# Patient Record
Sex: Male | Born: 1950 | Race: White | Hispanic: No | Marital: Married | Smoking: Never smoker
Health system: Southern US, Community
[De-identification: ages and names within clinical notes are randomized; demographics above are authoritative.]

## PROBLEM LIST (undated history)

## (undated) DIAGNOSIS — I10 Essential (primary) hypertension: Secondary | ICD-10-CM

## (undated) DIAGNOSIS — I5189 Other ill-defined heart diseases: Secondary | ICD-10-CM

## (undated) DIAGNOSIS — N289 Disorder of kidney and ureter, unspecified: Secondary | ICD-10-CM

## (undated) DIAGNOSIS — E785 Hyperlipidemia, unspecified: Secondary | ICD-10-CM

## (undated) DIAGNOSIS — I499 Cardiac arrhythmia, unspecified: Secondary | ICD-10-CM

## (undated) DIAGNOSIS — N281 Cyst of kidney, acquired: Secondary | ICD-10-CM

## (undated) DIAGNOSIS — K219 Gastro-esophageal reflux disease without esophagitis: Secondary | ICD-10-CM

## (undated) HISTORY — DX: Disorder of kidney and ureter, unspecified: N28.9

## (undated) HISTORY — DX: Essential (primary) hypertension: I10

## (undated) HISTORY — DX: Cyst of kidney, acquired: N28.1

## (undated) HISTORY — DX: Gastro-esophageal reflux disease without esophagitis: K21.9

## (undated) HISTORY — DX: Hyperlipidemia, unspecified: E78.5

## (undated) HISTORY — DX: Cardiac arrhythmia, unspecified: I49.9

## (undated) HISTORY — DX: Other ill-defined heart diseases: I51.89

---

## 2000-10-25 ENCOUNTER — Encounter: Payer: Self-pay | Admitting: Urology

## 2000-10-25 ENCOUNTER — Encounter: Admission: RE | Admit: 2000-10-25 | Discharge: 2000-10-25 | Payer: Self-pay | Admitting: Urology

## 2003-09-18 ENCOUNTER — Encounter (INDEPENDENT_AMBULATORY_CARE_PROVIDER_SITE_OTHER): Payer: Self-pay | Admitting: *Deleted

## 2003-09-18 ENCOUNTER — Ambulatory Visit (HOSPITAL_COMMUNITY): Admission: RE | Admit: 2003-09-18 | Discharge: 2003-09-18 | Payer: Self-pay | Admitting: Urology

## 2004-02-13 ENCOUNTER — Emergency Department (HOSPITAL_COMMUNITY): Admission: EM | Admit: 2004-02-13 | Discharge: 2004-02-13 | Payer: Self-pay | Admitting: Emergency Medicine

## 2004-02-13 ENCOUNTER — Encounter: Admission: RE | Admit: 2004-02-13 | Discharge: 2004-02-13 | Payer: Self-pay | Admitting: Internal Medicine

## 2005-04-04 HISTORY — PX: KIDNEY CYST REMOVAL: SHX684

## 2006-01-01 ENCOUNTER — Encounter: Admission: RE | Admit: 2006-01-01 | Discharge: 2006-01-01 | Payer: Self-pay | Admitting: Internal Medicine

## 2006-04-24 ENCOUNTER — Ambulatory Visit: Payer: Self-pay | Admitting: Family Medicine

## 2006-04-24 ENCOUNTER — Encounter: Admission: RE | Admit: 2006-04-24 | Discharge: 2006-04-24 | Payer: Self-pay | Admitting: Family Medicine

## 2006-05-13 ENCOUNTER — Encounter: Admission: RE | Admit: 2006-05-13 | Discharge: 2006-05-13 | Payer: Self-pay | Admitting: Family Medicine

## 2006-05-19 ENCOUNTER — Ambulatory Visit: Payer: Self-pay | Admitting: Family Medicine

## 2006-05-19 LAB — CONVERTED CEMR LAB
Calcium: 9.4 mg/dL (ref 8.4–10.5)
Glucose, Bld: 101 mg/dL — ABNORMAL HIGH (ref 70–99)
Sodium: 141 meq/L (ref 135–145)

## 2006-08-29 DIAGNOSIS — E785 Hyperlipidemia, unspecified: Secondary | ICD-10-CM | POA: Insufficient documentation

## 2006-08-29 DIAGNOSIS — I10 Essential (primary) hypertension: Secondary | ICD-10-CM | POA: Insufficient documentation

## 2006-08-29 DIAGNOSIS — N281 Cyst of kidney, acquired: Secondary | ICD-10-CM

## 2006-08-29 DIAGNOSIS — E119 Type 2 diabetes mellitus without complications: Secondary | ICD-10-CM | POA: Insufficient documentation

## 2006-11-22 ENCOUNTER — Ambulatory Visit: Payer: Self-pay | Admitting: Family Medicine

## 2006-11-22 DIAGNOSIS — R945 Abnormal results of liver function studies: Secondary | ICD-10-CM | POA: Insufficient documentation

## 2006-11-22 DIAGNOSIS — K7689 Other specified diseases of liver: Secondary | ICD-10-CM

## 2006-11-22 DIAGNOSIS — R61 Generalized hyperhidrosis: Secondary | ICD-10-CM | POA: Insufficient documentation

## 2006-11-22 DIAGNOSIS — K760 Fatty (change of) liver, not elsewhere classified: Secondary | ICD-10-CM | POA: Insufficient documentation

## 2006-11-26 LAB — CONVERTED CEMR LAB
ALT: 70 units/L — ABNORMAL HIGH (ref 0–53)
BUN: 13 mg/dL (ref 6–23)
Basophils Relative: 0.6 % (ref 0.0–1.0)
Chloride: 101 meq/L (ref 96–112)
Creatinine, Ser: 0.9 mg/dL (ref 0.4–1.5)
Eosinophils Relative: 1.3 % (ref 0.0–5.0)
Glucose, Bld: 114 mg/dL — ABNORMAL HIGH (ref 70–99)
HCT: 44.1 % (ref 39.0–52.0)
Hemoglobin: 15.2 g/dL (ref 13.0–17.0)
LDL Cholesterol: 107 mg/dL — ABNORMAL HIGH (ref 0–99)
MCV: 96 fL (ref 78.0–100.0)
Monocytes Absolute: 0.5 10*3/uL (ref 0.2–0.7)
Monocytes Relative: 6.5 % (ref 3.0–11.0)
Neutro Abs: 4.4 10*3/uL (ref 1.4–7.7)
Platelets: 202 10*3/uL (ref 150–400)
Potassium: 4.5 meq/L (ref 3.5–5.1)
RDW: 12.1 % (ref 11.5–14.6)
TSH: 2.87 microintl units/mL (ref 0.35–5.50)
Total CHOL/HDL Ratio: 6.5
WBC: 7 10*3/uL (ref 4.5–10.5)

## 2006-11-27 ENCOUNTER — Encounter (INDEPENDENT_AMBULATORY_CARE_PROVIDER_SITE_OTHER): Payer: Self-pay | Admitting: *Deleted

## 2006-11-27 ENCOUNTER — Telehealth (INDEPENDENT_AMBULATORY_CARE_PROVIDER_SITE_OTHER): Payer: Self-pay | Admitting: *Deleted

## 2007-04-23 ENCOUNTER — Telehealth (INDEPENDENT_AMBULATORY_CARE_PROVIDER_SITE_OTHER): Payer: Self-pay | Admitting: *Deleted

## 2007-04-23 ENCOUNTER — Ambulatory Visit: Payer: Self-pay | Admitting: Family Medicine

## 2007-05-02 ENCOUNTER — Telehealth (INDEPENDENT_AMBULATORY_CARE_PROVIDER_SITE_OTHER): Payer: Self-pay | Admitting: *Deleted

## 2007-05-04 ENCOUNTER — Telehealth (INDEPENDENT_AMBULATORY_CARE_PROVIDER_SITE_OTHER): Payer: Self-pay | Admitting: *Deleted

## 2007-05-04 ENCOUNTER — Encounter (INDEPENDENT_AMBULATORY_CARE_PROVIDER_SITE_OTHER): Payer: Self-pay | Admitting: *Deleted

## 2007-05-04 LAB — CONVERTED CEMR LAB
ALT: 83 units/L — ABNORMAL HIGH (ref 0–53)
CO2: 25 meq/L (ref 19–32)
Creatinine, Ser: 1 mg/dL (ref 0.4–1.5)
GFR calc Af Amer: 100 mL/min
Glucose, Bld: 159 mg/dL — ABNORMAL HIGH (ref 70–99)
Hgb A1c MFr Bld: 7.2 % — ABNORMAL HIGH (ref 4.6–6.0)
Potassium: 4.1 meq/L (ref 3.5–5.1)
VLDL: 37 mg/dL (ref 0–40)

## 2007-05-15 ENCOUNTER — Telehealth (INDEPENDENT_AMBULATORY_CARE_PROVIDER_SITE_OTHER): Payer: Self-pay | Admitting: *Deleted

## 2007-09-17 ENCOUNTER — Ambulatory Visit: Payer: Self-pay | Admitting: Family Medicine

## 2007-09-17 DIAGNOSIS — K219 Gastro-esophageal reflux disease without esophagitis: Secondary | ICD-10-CM | POA: Insufficient documentation

## 2007-09-17 DIAGNOSIS — N259 Disorder resulting from impaired renal tubular function, unspecified: Secondary | ICD-10-CM | POA: Insufficient documentation

## 2007-09-19 ENCOUNTER — Encounter (INDEPENDENT_AMBULATORY_CARE_PROVIDER_SITE_OTHER): Payer: Self-pay | Admitting: *Deleted

## 2007-09-19 LAB — CONVERTED CEMR LAB
AST: 126 units/L — ABNORMAL HIGH (ref 0–37)
Albumin: 4.2 g/dL (ref 3.5–5.2)
BUN: 13 mg/dL (ref 6–23)
Bilirubin, Direct: 0.1 mg/dL (ref 0.0–0.3)
Calcium: 9.5 mg/dL (ref 8.4–10.5)
Creatinine,U: 76 mg/dL
Direct LDL: 129.6 mg/dL
GFR calc Af Amer: 113 mL/min
Hgb A1c MFr Bld: 8.5 % — ABNORMAL HIGH (ref 4.6–6.0)
Microalb, Ur: 0.3 mg/dL (ref 0.0–1.9)
Potassium: 4 meq/L (ref 3.5–5.1)
Total CHOL/HDL Ratio: 6.3
Triglycerides: 210 mg/dL (ref 0–149)

## 2007-09-20 ENCOUNTER — Encounter (INDEPENDENT_AMBULATORY_CARE_PROVIDER_SITE_OTHER): Payer: Self-pay | Admitting: *Deleted

## 2007-10-08 ENCOUNTER — Encounter: Admission: RE | Admit: 2007-10-08 | Discharge: 2007-10-08 | Payer: Self-pay | Admitting: Family Medicine

## 2007-10-10 ENCOUNTER — Telehealth (INDEPENDENT_AMBULATORY_CARE_PROVIDER_SITE_OTHER): Payer: Self-pay | Admitting: *Deleted

## 2007-10-18 ENCOUNTER — Encounter: Payer: Self-pay | Admitting: Family Medicine

## 2007-10-31 ENCOUNTER — Telehealth (INDEPENDENT_AMBULATORY_CARE_PROVIDER_SITE_OTHER): Payer: Self-pay | Admitting: *Deleted

## 2008-01-03 ENCOUNTER — Ambulatory Visit: Payer: Self-pay | Admitting: Family Medicine

## 2008-01-10 ENCOUNTER — Telehealth (INDEPENDENT_AMBULATORY_CARE_PROVIDER_SITE_OTHER): Payer: Self-pay | Admitting: *Deleted

## 2008-01-14 LAB — CONVERTED CEMR LAB
ALT: 37 units/L (ref 0–53)
Alkaline Phosphatase: 46 units/L (ref 39–117)
BUN: 16 mg/dL (ref 6–23)
Bilirubin, Direct: 0.1 mg/dL (ref 0.0–0.3)
Cholesterol: 175 mg/dL (ref 0–200)
GFR calc non Af Amer: 93 mL/min
Glucose, Bld: 119 mg/dL — ABNORMAL HIGH (ref 70–99)
HDL: 30.4 mg/dL — ABNORMAL LOW (ref >39.0)
Hgb A1c MFr Bld: 6.2 % — ABNORMAL HIGH (ref 4.6–6.0)
Microalb Creat Ratio: 4.2 mg/g (ref 0.0–30.0)
Microalb, Ur: 0.3 mg/dL (ref 0.0–1.9)
Sodium: 138 meq/L (ref 135–145)
Total CHOL/HDL Ratio: 5.8
Total Protein: 7.2 g/dL (ref 6.0–8.3)
Triglycerides: 152 mg/dL — ABNORMAL HIGH (ref 0–149)
VLDL: 30 mg/dL (ref 0–40)

## 2008-01-15 ENCOUNTER — Encounter (INDEPENDENT_AMBULATORY_CARE_PROVIDER_SITE_OTHER): Payer: Self-pay | Admitting: *Deleted

## 2008-05-06 ENCOUNTER — Ambulatory Visit: Payer: Self-pay | Admitting: Family Medicine

## 2008-05-06 DIAGNOSIS — M25519 Pain in unspecified shoulder: Secondary | ICD-10-CM | POA: Insufficient documentation

## 2008-05-16 ENCOUNTER — Telehealth: Payer: Self-pay | Admitting: Family Medicine

## 2008-05-16 DIAGNOSIS — M542 Cervicalgia: Secondary | ICD-10-CM | POA: Insufficient documentation

## 2008-05-29 ENCOUNTER — Encounter: Payer: Self-pay | Admitting: Family Medicine

## 2008-10-07 ENCOUNTER — Telehealth (INDEPENDENT_AMBULATORY_CARE_PROVIDER_SITE_OTHER): Payer: Self-pay | Admitting: *Deleted

## 2008-10-28 ENCOUNTER — Ambulatory Visit: Payer: Self-pay | Admitting: Family Medicine

## 2008-10-28 DIAGNOSIS — D239 Other benign neoplasm of skin, unspecified: Secondary | ICD-10-CM | POA: Insufficient documentation

## 2008-10-29 ENCOUNTER — Encounter (INDEPENDENT_AMBULATORY_CARE_PROVIDER_SITE_OTHER): Payer: Self-pay | Admitting: *Deleted

## 2008-10-30 ENCOUNTER — Encounter (INDEPENDENT_AMBULATORY_CARE_PROVIDER_SITE_OTHER): Payer: Self-pay | Admitting: *Deleted

## 2008-11-19 ENCOUNTER — Encounter: Payer: Self-pay | Admitting: Family Medicine

## 2009-02-02 ENCOUNTER — Telehealth: Payer: Self-pay | Admitting: Family Medicine

## 2009-02-12 ENCOUNTER — Ambulatory Visit: Payer: Self-pay | Admitting: Family Medicine

## 2009-02-12 LAB — CONVERTED CEMR LAB
Bilirubin Urine: NEGATIVE
Glucose, Urine, Semiquant: 250
Protein, U semiquant: NEGATIVE
Specific Gravity, Urine: 1.015
Urobilinogen, UA: 0.2
WBC Urine, dipstick: NEGATIVE
pH: 5.5

## 2009-02-17 ENCOUNTER — Encounter: Payer: Self-pay | Admitting: Family Medicine

## 2009-02-17 ENCOUNTER — Encounter: Admission: RE | Admit: 2009-02-17 | Discharge: 2009-02-17 | Payer: Self-pay | Admitting: Emergency Medicine

## 2009-02-25 ENCOUNTER — Telehealth: Payer: Self-pay | Admitting: Family Medicine

## 2009-02-25 LAB — CONVERTED CEMR LAB
Alkaline Phosphatase: 43 units/L (ref 39–117)
Cholesterol: 195 mg/dL (ref 0–200)
Creatinine,U: 72 mg/dL
GFR calc non Af Amer: 82.17 mL/min (ref >60)
Microalb, Ur: 0.3 mg/dL (ref 0.0–1.9)
Sodium: 140 meq/L (ref 135–145)
Total Protein: 7.4 g/dL (ref 6.0–8.3)
VLDL: 39.8 mg/dL (ref 0.0–40.0)

## 2009-09-23 ENCOUNTER — Ambulatory Visit: Payer: Self-pay | Admitting: Family Medicine

## 2009-09-23 ENCOUNTER — Telehealth (INDEPENDENT_AMBULATORY_CARE_PROVIDER_SITE_OTHER): Payer: Self-pay

## 2009-09-23 DIAGNOSIS — R079 Chest pain, unspecified: Secondary | ICD-10-CM | POA: Insufficient documentation

## 2009-09-24 ENCOUNTER — Ambulatory Visit: Payer: Self-pay

## 2009-09-24 ENCOUNTER — Encounter: Payer: Self-pay | Admitting: Cardiology

## 2009-09-24 ENCOUNTER — Telehealth: Payer: Self-pay | Admitting: Family Medicine

## 2009-09-24 ENCOUNTER — Ambulatory Visit: Payer: Self-pay | Admitting: Cardiology

## 2009-09-24 ENCOUNTER — Encounter: Payer: Self-pay | Admitting: Family Medicine

## 2009-09-24 ENCOUNTER — Encounter (HOSPITAL_COMMUNITY): Admission: RE | Admit: 2009-09-24 | Discharge: 2009-12-09 | Payer: Self-pay | Admitting: Family Medicine

## 2009-09-24 ENCOUNTER — Ambulatory Visit (HOSPITAL_COMMUNITY): Admission: RE | Admit: 2009-09-24 | Discharge: 2009-09-24 | Payer: Self-pay | Admitting: Family Medicine

## 2009-09-25 DIAGNOSIS — I519 Heart disease, unspecified: Secondary | ICD-10-CM | POA: Insufficient documentation

## 2009-10-16 ENCOUNTER — Encounter: Payer: Self-pay | Admitting: Family Medicine

## 2009-11-04 ENCOUNTER — Encounter: Admission: RE | Admit: 2009-11-04 | Discharge: 2009-11-04 | Payer: Self-pay | Admitting: Urology

## 2009-11-12 ENCOUNTER — Ambulatory Visit (HOSPITAL_COMMUNITY): Admission: RE | Admit: 2009-11-12 | Discharge: 2009-11-12 | Payer: Self-pay | Admitting: Urology

## 2009-11-23 ENCOUNTER — Ambulatory Visit: Payer: Self-pay | Admitting: Cardiology

## 2010-04-22 ENCOUNTER — Ambulatory Visit
Admission: RE | Admit: 2010-04-22 | Discharge: 2010-04-22 | Payer: Self-pay | Source: Home / Self Care | Attending: Family Medicine | Admitting: Family Medicine

## 2010-04-22 LAB — CONVERTED CEMR LAB
Ketones, urine, test strip: NEGATIVE
Protein, U semiquant: NEGATIVE
pH: 5

## 2010-04-23 ENCOUNTER — Ambulatory Visit (HOSPITAL_BASED_OUTPATIENT_CLINIC_OR_DEPARTMENT_OTHER)
Admission: RE | Admit: 2010-04-23 | Discharge: 2010-04-23 | Payer: Self-pay | Source: Home / Self Care | Attending: Family Medicine | Admitting: Family Medicine

## 2010-04-25 ENCOUNTER — Encounter: Payer: Self-pay | Admitting: Urology

## 2010-04-25 ENCOUNTER — Encounter: Payer: Self-pay | Admitting: Family Medicine

## 2010-05-02 LAB — CONVERTED CEMR LAB
ALT: 56 units/L — ABNORMAL HIGH (ref 0–53)
AST: 38 units/L — ABNORMAL HIGH (ref 0–37)
AST: 47 units/L — ABNORMAL HIGH (ref 0–37)
Albumin: 4.3 g/dL (ref 3.5–5.2)
Albumin: 4.4 g/dL (ref 3.5–5.2)
Basophils Absolute: 0 10*3/uL (ref 0.0–0.1)
Basophils Relative: 0.7 % (ref 0.0–3.0)
Bilirubin Urine: NEGATIVE
Bilirubin, Direct: 0.1 mg/dL (ref 0.0–0.3)
Calcium: 9.3 mg/dL (ref 8.4–10.5)
Calcium: 9.5 mg/dL (ref 8.4–10.5)
Cholesterol: 186 mg/dL (ref 0–200)
Creatinine, Ser: 1.1 mg/dL (ref 0.4–1.5)
Creatinine,U: 83.6 mg/dL
Creatinine,U: 91.2 mg/dL
Eosinophils Absolute: 0.1 10*3/uL (ref 0.0–0.7)
Eosinophils Relative: 1.1 % (ref 0.0–5.0)
GFR calc non Af Amer: 76.66 mL/min (ref >60)
GFR calc non Af Amer: 82.26 mL/min (ref >60)
Glucose, Bld: 126 mg/dL — ABNORMAL HIGH (ref 70–99)
HCT: 43.1 % (ref 39.0–52.0)
HDL: 37.2 mg/dL — ABNORMAL LOW (ref >39.00)
Hemoglobin: 14.7 g/dL (ref 13.0–17.0)
Hgb A1c MFr Bld: 6.5 % (ref 4.6–6.5)
Hgb A1c MFr Bld: 6.7 % — ABNORMAL HIGH (ref 4.6–6.5)
LDL Cholesterol: 118 mg/dL — ABNORMAL HIGH (ref 0–99)
MCHC: 33.5 g/dL (ref 30.0–36.0)
MCHC: 34 g/dL (ref 30.0–36.0)
MCV: 97.8 fL (ref 78.0–100.0)
Microalb Creat Ratio: 0.8 mg/g (ref 0.0–30.0)
Microalb Creat Ratio: 7.2 mg/g (ref 0.0–30.0)
Monocytes Absolute: 0.3 10*3/uL (ref 0.1–1.0)
Monocytes Absolute: 0.4 10*3/uL (ref 0.1–1.0)
Monocytes Relative: 4.5 % (ref 3.0–12.0)
Neutro Abs: 2.8 10*3/uL (ref 1.4–7.7)
Neutrophils Relative %: 54.2 % (ref 43.0–77.0)
Neutrophils Relative %: 60 % (ref 43.0–77.0)
Nitrite: NEGATIVE
PSA: 0.35 ng/mL (ref 0.10–4.00)
Platelets: 203 10*3/uL (ref 150.0–400.0)
Potassium: 4.8 meq/L (ref 3.5–5.1)
Protein, U semiquant: NEGATIVE
RBC: 4.34 M/uL (ref 4.22–5.81)
RBC: 4.38 M/uL (ref 4.22–5.81)
RDW: 12.1 % (ref 11.5–14.6)
RDW: 13.2 % (ref 11.5–14.6)
Total CHOL/HDL Ratio: 5
Total CK: 464 units/L — ABNORMAL HIGH (ref 7–232)
Total Protein: 7.7 g/dL (ref 6.0–8.3)
Triglycerides: 180 mg/dL — ABNORMAL HIGH (ref 0.0–149.0)
Troponin I: <0.01 ng/mL (ref <0.06)
VLDL: 34.2 mg/dL (ref 0.0–40.0)
VLDL: 36 mg/dL (ref 0.0–40.0)
WBC Urine, dipstick: NEGATIVE
WBC: 6 10*3/uL (ref 4.5–10.5)
pH: 5

## 2010-05-04 NOTE — Consult Note (Signed)
Summary: [facility]  [facility]   Imported By: Lanelle Bal 10/22/2009 12:35:18  _____________________________________________________________________  External Attachment:    Type:   Image     Comment:   External Document

## 2010-05-04 NOTE — Assessment & Plan Note (Signed)
Summary: np6/diastolic dysfunction   Primary Provider:  Laury Axon  CC:  no complaints.  History of Present Illness: 60 year old male for evaluation of diastolic dysfunction and chest pain. A Myoview was performed in June of 2011. Ejection fraction was 62% and there was no ischemia. An echocardiogram was performed in June of 2011 that showed normal LV function, grade 2 diastolic dysfunction. Because of the above we were asked to further evaluate. The patient states that in April he was running sprints. He developed a sharp pain in the left axillary area. It lasted seconds and resolved when he stopped. He continues to have an aching sensation in his left axilla. It is continuous without ever completely resolving. He occasionally feels numbness down his left arm but states he has had problems with cervical disc. He denies any significant dyspnea on exertion, orthopnea, PND, pedal edema, palpitations, syncope. He exercises routinely swimming for one hour or walking for one hour with no chest pain. Because of his chest pain and diastolic dysfunction on his echo we were asked to further evaluate.  Current Medications (verified): 1)  Metformin Hcl 1000 Mg  Tabs (Metformin Hcl) .Marland Kitchen.. 1 By Mouth Two Times A Day 2)  Lisinopril-Hydrochlorothiazide 20-12.5 Mg  Tabs (Lisinopril-Hydrochlorothiazide) .... Take One Tablet in Morning and One Tablet in The Evening 3)  Free Style Freedom Lite Test Strips .... Use One Strip Each Morning Before Breakfast. 4)  Drysol 20 %  Soln (Aluminum Chloride) .... Apply At Night Time For One Month and Then Apply Nightly Once Weekly.prn 5)  Freestyle Lancets   Misc (Lancets) .... Use One As Directed Each Morning 6)  Aspirin 81 Mg Chew (Aspirin) .... As Needed  Allergies: 1)  ! Codeine 2)  ! Zocor 3)  ! Pravachol  Past History:  Past Medical History: HYPERTENSION  HYPERLIPIDEMIA declined treatment DIASTOLIC DYSFUNCTION  RENAL CYST NEVI, MULTIPLE  GERD  RENAL INSUFFICIENCY    FATTY LIVER DISEASE  HYPERHIDROSIS DIABETES MELLITUS, TYPE II  H/O statin intolerance  Past Surgical History: Reviewed history from 10/28/2008 and no changes required. drained kidney cyst 2007---  [facility]   Family History: Reviewed history from 10/28/2008 and no changes required. Father with CABG in his 51s Family History Diabetes 1st degree relative Family History High cholesterol Family History Hypertension Family History of Stroke F 1st degree relative 58yo Family History of Skin cancer  Social History: Reviewed history from 10/28/2008 and no changes required. Never Smoked Alcohol use-rare Drug use-no Married Occupation: Runner, broadcasting/film/video--- [employer] Regular exercise-yes  Review of Systems       no fevers or chills, productive cough, hemoptysis, dysphasia, odynophagia, melena, hematochezia, dysuria, hematuria, rash, seizure activity, orthopnea, PND, pedal edema, claudication. Remaining systems are negative.   Vital Signs:  Patient profile:   60 year old male Height:      70 inches Weight:      200 pounds BMI:     28.80 Pulse rate:   63 / minute Resp:     14 per minute BP sitting:   119 / 67  (left arm)  Vitals Entered By: Kem Parkinson (November 23, 2009 11:51 AM)  Physical Exam  General:  Well developed/well nourished in NAD Skin warm/dry Patient not depressed No peripheral clubbing Back-normal HEENT-normal/normal eyelids Neck supple/normal carotid upstroke bilaterally; no bruits; no JVD; no thyromegaly chest - CTA/ normal expansion CV - RRR/normal S1 and S2; no  rubs or gallops; no rub; PMI nondisplaced; 2/6 systolic ejection murmur. Abdomen -NT/ND, no HSM, no mass, +  bowel sounds, no bruit 2+ femoral pulses, no bruits Ext-no edema, chords, 2+ DP Neuro-grossly nonfocal     EKG  Procedure date:  09/23/2009  Findings:      past this rhythm at a rate of 59. Axis normal. Minor nonspecific ST changes.  Impression &  Recommendations:  Problem # 1:  CHEST PAIN UNSPECIFIED (ICD-786.50) Symptoms extremely atypical. His left upper extremity numbness and discomfort is most likely related to his cervical disc problem. He exercises routinely without any significant chest pain. His myoview was normal. I do not think further cardiac evaluation is indicated. I did ask the patient to take enteric-coated aspirin 81 mg p.o. daily. His updated medication list for this problem includes:    Lisinopril-hydrochlorothiazide 20-12.5 Mg Tabs (Lisinopril-hydrochlorothiazide) .Marland Kitchen... Take one tablet in morning and one tablet in the evening    Aspirin 81 Mg Chew (Aspirin) .Marland Kitchen... As needed  Problem # 2:  HYPERTENSION (ICD-401.9) His blood pressure is controlled on present medications. He was noted to have grade 2 diastolic dysfunction on his echocardiogram. I've asked him to purchase a blood pressure cuff and monitor at home. His systolic blood pressure should be less than 130 and diastolic less than 85. He will continue on his present blood pressure medications and be diligent in treating. His updated medication list for this problem includes:    Lisinopril-hydrochlorothiazide 20-12.5 Mg Tabs (Lisinopril-hydrochlorothiazide) .Marland Kitchen... Take one tablet in morning and one tablet in the evening    Aspirin 81 Mg Chew (Aspirin) .Marland Kitchen... As needed  Problem # 3:  HYPERLIPIDEMIA (ICD-272.4) Continue diet. Management per primary care. The following medications were removed from the medication list:    Antara 130 Mg Caps (Fenofibrate micronized) .Marland Kitchen... 1 by mouth once daily.    Lipitor 10 Mg Tabs (Atorvastatin calcium) .Marland Kitchen... 1 by mouth at bedtime.  Problem # 4:  DIASTOLIC DYSFUNCTION (ICD-429.9) As per #2 we will need to be diligent in treating his diabetes and hypertension. He is not having symptoms from this and therefore a diuretic has not been added.  Problem # 5:  DIABETES MELLITUS, TYPE II (ICD-250.00)  His updated medication list for this  problem includes:    Metformin Hcl 1000 Mg Tabs (Metformin hcl) .Marland Kitchen... 1 by mouth two times a day    Lisinopril-hydrochlorothiazide 20-12.5 Mg Tabs (Lisinopril-hydrochlorothiazide) .Marland Kitchen... Take one tablet in morning and one tablet in the evening    Aspirin 81 Mg Chew (Aspirin) .Marland Kitchen... As needed

## 2010-05-04 NOTE — Assessment & Plan Note (Signed)
Summary: med refill with blood work//lch   Vital Signs:  Patient profile:   60 year old male Height:      70 inches Weight:      202 pounds BMI:     29.09 Pulse rate:   82 / minute Pulse rhythm:   regular BP sitting:   120 / 76  (left arm) Cuff size:   regular  Vitals Entered By: Army Fossa CMA (September 23, 2009 8:08 AM) CC: Pt here for med refills and bloodwork. Also states he has a pain in his left shoulder x 4 months that comes and goes.    History of Present Illness: Pt here for med refill and labs He c/o L shoulder pain x 4 months.  Pt states he had chest pain while running and when he rested it went away within seconds.  last episode was a few days ago--- pt was sitting and watching tv when L side chest and armpit started to hurt and it radiated down L arm--it lasted several seconds.  + some nausea.  It does not occur everytime he exercises.  He walks everyday.    Hyperlipidemia follow-up      This is a 60 year old man who presents for Hyperlipidemia follow-up.  The patient denies muscle aches, GI upset, abdominal pain, flushing, itching, constipation, diarrhea, and fatigue.  Other symptoms include chest pain/pressure and exercise intolerance.  The patient denies the following symptoms: dypsnea, palpitations, syncope, and pedal edema.  Compliance with medications (by patient report) has been near 100%.  Dietary compliance has been good.  The patient reports exercising daily.    Hypertension follow-up      The patient also presents for Hypertension follow-up.  The patient denies lightheadedness, urinary frequency, headaches, edema, impotence, rash, and fatigue.  Associated symptoms include chest pain, chest pressure, exercise intolerance, and dyspnea.  The patient denies the following associated symptoms: palpitations, syncope, leg edema, and pedal edema.  Compliance with medications (by patient report) has been near 100%.  The patient reports that dietary compliance has been good.   The patient reports exercising daily.  Adjunctive measures currently used by the patient include salt restriction.    Type 1 diabetes mellitus follow-up      The patient is also here for Type 2 diabetes mellitus follow-up.  The patient denies polyuria, polydipsia, blurred vision, self managed hypoglycemia, hypoglycemia requiring help, weight loss, weight gain, and numbness of extremities.  Other symptoms include chest pain.  The patient denies the following symptoms: neuropathic pain, vomiting, orthostatic symptoms, poor wound healing, intermittent claudication, vision loss, and foot ulcer.  Since the last visit the patient reports good dietary compliance, compliance with medications, exercising regularly, and not monitoring blood glucose.  Since the last visit, the patient reports having had eye care by an ophthalmologist and no foot care.    Current Medications (verified): 1)  Metformin Hcl 1000 Mg  Tabs (Metformin Hcl) .Marland Kitchen.. 1 By Mouth Two Times A Day 2)  Lisinopril-Hydrochlorothiazide 20-12.5 Mg  Tabs (Lisinopril-Hydrochlorothiazide) .... Take One Tablet in Morning and One Tablet in The Evening 3)  Free Style Freedom Lite Test Strips .... Use One Strip Each Morning Before Breakfast. 4)  Drysol 20 %  Soln (Aluminum Chloride) .... Apply At Night Time For One Month and Then Apply Nightly Once Weekly. 5)  Freestyle Lancets   Misc (Lancets) .... Use One As Directed Each Morning 6)  Antara 130 Mg Caps (Fenofibrate Micronized) .Marland Kitchen.. 1 By Mouth Once Daily. 7)  Aspirin 81 Mg Chew (Aspirin) .Marland Kitchen.. 1 By Mouth Once Daily  Allergies: 1)  ! Codeine 2)  ! Zocor 3)  ! Pravachol  Past History:  Past medical, surgical, family and social histories (including risk factors) reviewed for relevance to current acute and chronic problems.  Past Medical History: Reviewed history from 09/17/2007 and no changes required. Diabetes mellitus, type II Hyperlipidemia-declined treatment Hypertension Renal  insufficiency GERD  Past Surgical History: Reviewed history from 10/28/2008 and no changes required. drained kidney cyst 2007---  [place]   Family History: Reviewed history from 10/28/2008 and no changes required. Family History of CAD Male 1st degree relative <50 Family History Diabetes 1st degree relative Family History High cholesterol Family History Hypertension Family History of Stroke F 1st degree relative 58yo Family History of Skin cancer  Social History: Reviewed history from 10/28/2008 and no changes required. 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   Lungs:  Normal respiratory effort, chest expands symmetrically. Lungs are clear to auscultation, no crackles or wheezes. Heart:  normal rate and no murmur.   Msk:  normal ROM, no joint tenderness, and no joint swelling.   Extremities:  No clubbing, cyanosis, edema, or deformity noted with normal full range of motion of all joints.   Neurologic:  alert & oriented X3 and gait normal.   Cervical Nodes:  No lymphadenopathy noted Psych:  Oriented X3 and normally interactive.     Impression & Recommendations:  Problem # 1:  CHEST PAIN UNSPECIFIED (ICD-786.50)  Orders: Venipuncture (16109) TLB-Cardiac Panel ([id number]) TLB-A1C / Hgb A1C (Glycohemoglobin) (83036-A1C) TLB-Microalbumin/Creat Ratio, Urine (82043-MALB) TLB-PSA (Prostate Specific Antigen) (84153-PSA) T-D-Dimer Fibrin Derivatives Quantitive [phone]) T- * Misc. Laboratory test [phone]) EKG w/ Interpretation (93000) Cardiolite (Cardiolite) Echo Referral (Echo)  Problem # 2:  RENAL CYST (ICD-593.2) PT SEEING UROLOGY NEXT MONTH [place] TO  BE SENT TO DR GRAPPE  Problem # 3:  FATTY LIVER DISEASE (ICD-571.8)  Orders: Venipuncture (69629) TLB-Cardiac Panel ([id number])  Problem # 4:  HYPERTENSION (ICD-401.9)  His updated medication list for this problem includes:    Lisinopril-hydrochlorothiazide 20-12.5 Mg Tabs (Lisinopril-hydrochlorothiazide) .Marland Kitchen... Take one tablet in morning and one tablet in the evening  Orders: Venipuncture (25366) TLB-Cardiac Panel ([id number]) TLB-A1C / Hgb A1C (Glycohemoglobin) (83036-A1C) TLB-Microalbumin/Creat Ratio, Urine (82043-MALB) TLB-PSA (Prostate Specific Antigen) (84153-PSA) T-D-Dimer Fibrin Derivatives Quantitive [phone]) T- * Misc. Laboratory test [phone]) Cardiolite (Cardiolite) Echo Referral (Echo)  BP today: 120/76 Prior BP: 122/78 (02/12/2009)  Labs Reviewed: K+: 5.3 (02/12/2009) Creat: : 1.0 (02/12/2009)   Chol: 195 (02/12/2009)   HDL: 31.30 (02/12/2009)   LDL: 124 (02/12/2009)   TG: 199.0 (02/12/2009)  Problem # 5:  HYPERLIPIDEMIA (ICD-272.4)  The following medications were removed from the medication list:    Pravachol 40 Mg Tabs (Pravastatin sodium) .Marland Kitchen... Take one tablet daily. His updated medication list for this problem includes:    Antara 130 Mg Caps (Fenofibrate micronized) .Marland Kitchen... 1 by mouth once daily.  Orders: Venipuncture (60630) TLB-Cardiac Panel ([id number]) TLB-A1C / Hgb A1C (Glycohemoglobin) (83036-A1C) TLB-Microalbumin/Creat Ratio, Urine (82043-MALB) TLB-PSA (Prostate Specific Antigen) (84153-PSA) T-D-Dimer Fibrin Derivatives Quantitive [phone]) T- * Misc. Laboratory test [phone])  Labs Reviewed: SGOT: 50 (02/12/2009)   SGPT: 51 (02/12/2009)   HDL:31.30 (02/12/2009), 33.40 (10/28/2008)  LDL:124 (02/12/2009), 117 (10/28/2008)  Chol:195 (02/12/2009), 186 (10/28/2008)  Trig:199.0 (02/12/2009), 180.0 (10/28/2008)  Problem # 6:  DIABETES MELLITUS, TYPE II (ICD-250.00)  His updated medication list for this problem includes:     Metformin Hcl 1000 Mg Tabs (Metformin hcl) .Marland Kitchen... 1 by mouth two times a day    Lisinopril-hydrochlorothiazide 20-12.5 Mg Tabs (Lisinopril-hydrochlorothiazide) .Marland Kitchen... Take one tablet in morning and one tablet in the evening    Aspirin 81 Mg Chew (Aspirin) .Marland Kitchen... 1 by mouth once daily  Orders: Venipuncture (16109) TLB-Cardiac Panel ([id number]) TLB-A1C / Hgb A1C (Glycohemoglobin) (83036-A1C) TLB-Microalbumin/Creat Ratio, Urine (82043-MALB) TLB-PSA (Prostate Specific Antigen) (84153-PSA) T-D-Dimer Fibrin Derivatives Quantitive [phone]) T- * Misc. Laboratory test [phone])  Labs Reviewed: Creat: 1.0 (02/12/2009)    Reviewed HgBA1c results: 6.5 (02/12/2009)  6.5 (10/28/2008)  Complete Medication List: 1)  Metformin Hcl 1000 Mg Tabs (Metformin hcl) .Marland Kitchen.. 1 by mouth two times a day 2)  Lisinopril-hydrochlorothiazide 20-12.5 Mg Tabs (Lisinopril-hydrochlorothiazide) .... Take one tablet in morning and one tablet in the evening 3)  Free Style Freedom Lite Test Strips  .... Use one strip each morning before breakfast. 4)  Drysol 20 % Soln (Aluminum chloride) .... Apply at night time for one month and then apply nightly once weekly. 5)  Freestyle Lancets Misc (Lancets) .... Use one as directed each morning 6)  Antara 130 Mg Caps (Fenofibrate micronized) .Marland Kitchen.. 1 by mouth once daily. 7)  Aspirin 81 Mg Chew (Aspirin) .Marland Kitchen.. 1 by mouth once daily  Other Orders: TLB-Lipid Panel (80061-LIPID) TLB-BMP (Basic Metabolic Panel-BMET) (80048-METABOL) TLB-CBC Platelet - w/Differential (85025-CBCD) TLB-Hepatic/Liver Function Pnl (80076-HEPATIC) TLB-TSH (Thyroid Stimulating Hormone) (84443-TSH)  Patient Instructions: 1)  ECHO/ STRESS TEST BEING SCHEDULED 2)  TAKE 81MG  ASPIRIN A DAY 3)  IF PAIN RETURNS --GO TO ER Prescriptions: LISINOPRIL-HYDROCHLOROTHIAZIDE 20-12.5 MG  TABS (LISINOPRIL-HYDROCHLOROTHIAZIDE) Take one tablet in morning and one tablet in the evening  #180 x 3   Entered and Authorized by:    Loreen Freud DO   Signed by:   Loreen Freud DO on 09/23/2009   Method used:   Print then Give to Patient   RxID:   [id number] METFORMIN HCL 1000 MG  TABS (METFORMIN HCL) 1 by mouth two times a day  #180 x 3   Entered and Authorized by:   Loreen Freud DO   Signed by:   Loreen Freud DO on 09/23/2009   Method used:   Print then Give to Patient   RxID:   [id number]    EKG  Procedure date:  09/23/2009  Findings:      Normal sinus rhythm with rate of:  59 bpm

## 2010-05-04 NOTE — Progress Notes (Signed)
Summary: Lab results   Phone Note Outgoing Call   Call placed by: Army Fossa CMA,  September 24, 2009 9:55 AM Summary of Call: Regarding lab results, LMTCB on home phone that was left in the Reg notes:  TG and LDL elevated----cont antara---why was pravachol stopped----pt needs statin to decrease LDL < 70---diabetes goal and TG still elevated recheck 3 months  272.4 250.00  lipid, hep, bmp, hgba1c, microalbumin Signed by Loreen Freud DO on 09/23/2009 at 7:16 PM   Follow-up for Phone Call        Pt states that the pravachol gave him back aches. Army Fossa CMA  September 24, 2009 11:58 AM   Additional Follow-up for Phone Call Additional follow up Details #1::        try lipitor 10 mg 1 by mouth at bedtime #30  2 refills --------give $4 copay card Additional Follow-up by: Loreen Freud DO,  September 24, 2009 12:32 PM    Additional Follow-up for Phone Call Additional follow up Details #2::    Pt is aware will put copay card upfront. Army Fossa CMA  September 24, 2009 1:02 PM   New/Updated Medications: LIPITOR 10 MG TABS (ATORVASTATIN CALCIUM) 1 by mouth at bedtime. Prescriptions: LIPITOR 10 MG TABS (ATORVASTATIN CALCIUM) 1 by mouth at bedtime.  #30 x 2   Entered by:   Army Fossa CMA   Authorized by:   Loreen Freud DO   Signed by:   Army Fossa CMA on 09/24/2009   Method used:   Electronically to        [facility] [id number] (retail)       [address]       [place], [place]  [address]       Ph: [phone] or [phone]       Fax: [phone]   RxID:   [id number]

## 2010-05-04 NOTE — Assessment & Plan Note (Signed)
Summary: Cardiology Nuclear Study  Nuclear Med Background Indications for Stress Test: Evaluation for Ischemia   History: Echo, Myocardial Perfusion Study  History Comments:  ~6 yrs ago MPS:OK per patient; 09/24/09 Echo:EF=55-60%  Symptoms: Chest Pain, Chest Pain with Exertion, Chest Pressure, Chest Pressure with Exertion, Dizziness, DOE, Fatigue, Fatigue with Exertion, Light-Headedness, Palpitations  Symptoms Comments: CP>(L)arm. Last episode of UX:LKGM a.m., 1/10.   Nuclear Pre-Procedure Cardiac Risk Factors: Family History - CAD, Hypertension, Lipids, NIDDM Caffeine/Decaff Intake: None NPO After: 7:00 PM Lungs: Clear. IV 0.9% NS with Angio Cath: 22g     IV Site: (R) Hand IV Started by: Irean Hong RN Chest Size (in) 44     Height (in): 70 Weight (lb): 200 BMI: 28.80  Nuclear Med Study 1 or 2 day study:  1 day     Stress Test Type:  Stress Reading MD:  Marca Ancona, MD     Referring MD:  Loreen Freud, DO Resting Radionuclide:  Technetium 18m Tetrofosmin     Resting Radionuclide Dose:  11.0 mCi  Stress Radionuclide:  Technetium 48m Tetrofosmin     Stress Radionuclide Dose:  33.0 mCi   Stress Protocol Exercise Time (min):  10:45 min     Max HR:  157 bpm     Predicted Max HR:  164 bpm  Max Systolic BP: 193 mm Hg     Percent Max HR:  95.73 %Rate Pressure Product:  01027    Stress Test Technologist:  Rea [employer] CMA-N     Nuclear Technologist:  Domenic Polite CNMT  Rest Procedure  Myocardial perfusion imaging was performed at rest 45 minutes following the intravenous administration of Myoview Technetium 9m Tetrofosmin.  Stress Procedure  The patient exercised for 10:45.  The patient stopped due to fatigue.  He did c/o very mild chest pressure, 1/10, with exercise.  There were no diagnostic ST-T wave changes.  There were occasional PAC's and a short burst of PSVT immediately post exercise.  Myoview was injected at peak exercise and myocardial perfusion imaging was  performed after a brief delay.  QPS Raw Data Images:  Normal; no motion artifact; normal heart/lung ratio. Stress Images:  There is normal uptake in all areas. Rest Images:  Normal homogeneous uptake in all areas of the myocardium. Subtraction (SDS):  There is no evidence of scar or ischemia. Transient Ischemic Dilatation:  .89  (Normal <1.22)  Lung/Heart Ratio:  34  (Normal <0.45)  Quantitative Gated Spect Images QGS EDV:  89 ml QGS ESV:  34 ml QGS EF:  62 % QGS cine images:  Normal wall motion.    Overall Impression  Exercise Capacity: Good exercise capacity. BP Response: Hypertensive blood pressure response. Clinical Symptoms: Chest pain at peak exertion.  ECG Impression: No significant ST segment change suggestive of ischemia. 4 beat run of SVT.  Overall Impression: Normal stress nuclear study.  Appended Document: Cardiology Nuclear Study normal stress test  Appended Document: Cardiology Nuclear Study Pt is aware.

## 2010-05-04 NOTE — Progress Notes (Signed)
Summary: Nuc. Pre-Procedure  Phone Note Outgoing Call Call back at [facility] Phone [phone]   Call placed by: Irean Hong, RN,  September 23, 2009 3:02 PM Summary of Call: Reviewed information on Myoview Information Sheet (see scanned document for further details).  Spoke with patient.      Nuclear Med Background Indications for Stress Test: Evaluation for Ischemia     Symptoms: Chest Pain, Chest Pain with Exertion, Chest Pressure, DOE, Fatigue with Exertion, Light-Headedness    Nuclear Pre-Procedure Cardiac Risk Factors: Family History - CAD, Hypertension, Lipids, NIDDM Height (in): 70

## 2010-05-06 NOTE — Assessment & Plan Note (Signed)
Summary: left lower back--kidney issues--wants to t/about another scan...   Vital Signs:  Patient profile:   60 year old male Weight:      209.6 pounds Temp:     98.2 degrees F oral BP sitting:   140 / 80  (left arm) Cuff size:   large  Vitals Entered By: Almeta Monas CMA Duncan Dull) (April 22, 2010 2:55 PM) CC: c/o left kidney pain--wants to get another scan done   History of Present Illness: Pt here c/o Left kidney pain---if feels the same as last time.  He is very concerned and would like another scan done.  no other complaints.  Current Medications (verified): 1)  Metformin Hcl 1000 Mg  Tabs (Metformin Hcl) .Marland Kitchen.. 1 By Mouth Two Times A Day 2)  Lisinopril-Hydrochlorothiazide 20-12.5 Mg  Tabs (Lisinopril-Hydrochlorothiazide) .... Take One Tablet in Morning and One Tablet in The Evening 3)  Free Style Freedom Lite Test Strips .... Use One Strip Each Morning Before Breakfast. 4)  Drysol 20 %  Soln (Aluminum Chloride) .... Apply At Night Time For One Month and Then Apply Nightly Once Weekly.prn 5)  Freestyle Lancets   Misc (Lancets) .... Use One As Directed Each Morning 6)  Aspirin 81 Mg Chew (Aspirin) .... As Needed  Allergies (verified): 1)  ! Codeine 2)  ! Zocor 3)  ! Pravachol  Past History:  Past Medical History: Last updated: 11/23/2009 HYPERTENSION  HYPERLIPIDEMIA declined treatment DIASTOLIC DYSFUNCTION  RENAL CYST NEVI, MULTIPLE  GERD  RENAL INSUFFICIENCY  FATTY LIVER DISEASE  HYPERHIDROSIS DIABETES MELLITUS, TYPE II  H/O statin intolerance  Past Surgical History: Last updated: 10/28/2008 drained kidney cyst 2007---  [place]   Family History: Last updated: 11/23/2009 Father with CABG in his 62s Family History Diabetes 1st degree relative Family History High cholesterol Family History Hypertension Family History of Stroke F 1st degree relative 58yo Family History of Skin cancer  Social History: Last updated: 11/23/2009 Never Smoked Alcohol  use-rare Drug use-no Married Occupation: Runner, 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 Father with CABG in his 70s Family History Diabetes 1st degree relative Family History High cholesterol Family History Hypertension Family History of Stroke F 1st degree relative 58yo Family History of Skin cancer  Social History: Reviewed history from 11/23/2009 and no changes required. 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 Msk:  no flank pain with palpation only with certain movements Psych:  Oriented X3 and normally interactive.     Impression & Recommendations:  Problem # 1:  RENAL CYST (ICD-593.2)  Orders: Radiology Referral (Radiology) f/u urology  Complete Medication List: 1)  Metformin Hcl 1000 Mg Tabs (Metformin hcl) .Marland Kitchen.. 1 by mouth two times a day 2)  Lisinopril-hydrochlorothiazide 20-12.5 Mg Tabs (Lisinopril-hydrochlorothiazide) .... Take one tablet in morning and one tablet in the evening 3)  Free Style Freedom Lite Test Strips  .... Use one strip each morning before breakfast. 4)  Drysol 20 % Soln (Aluminum chloride) .... Apply at night time for one month and then apply nightly once weekly.prn 5)  Freestyle Lancets Misc (  Lancets) .... Use one as directed each morning 6)  Aspirin 81 Mg Chew (Aspirin) .... As needed   Orders Added: 1)  Radiology Referral [Radiology] 2)  Est. Patient  Level III [99213]    Laboratory Results   Urine Tests    Routine Urinalysis   Color: lt. yellow Appearance: Clear Glucose: negative   (Normal Range: Negative) Bilirubin: negative   (Normal Range: Negative) Ketone: negative   (Normal Range: Negative) Spec. Gravity: <1.005   (Normal Range: 1.003-1.035) Blood: trace-lysed   (Normal Range: Negative) pH: 5.0   (Normal Range: 5.0-8.0) Protein: negative   (Normal Range: Negative) Urobilinogen: 0.2   (Normal Range: 0-1) Nitrite: negative   (Normal Range: Negative) Leukocyte Esterace: negative   (Normal Range: Negative)

## 2010-06-18 LAB — CBC
HCT: 40.3 % (ref 39.0–52.0)
MCHC: 35 g/dL (ref 30.0–36.0)
MCV: 94.8 fL (ref 78.0–100.0)
WBC: 6.5 10*3/uL (ref 4.0–10.5)

## 2010-06-18 LAB — GLUCOSE, CAPILLARY: Glucose-Capillary: 177 mg/dL — ABNORMAL HIGH (ref 70–99)

## 2010-08-20 NOTE — Assessment & Plan Note (Signed)
[facility]                        [facility] OFFICE NOTE   NAME:Schley, Naod H  JR.                    MRN:          [id number]  DATE:04/24/2006                            DOB:          1950-07-15    Mr. John Beasley is a 60 year old male with a history of hypertension,  diabetes, and hyperlipidemia presenting to establish care.  His only  concern today is left shoulder pain for the last 6 to 8 weeks.  He  reports that he has not had any trauma that could have led to the pain.  He has difficulty abducting the arm at 45 and 90 degrees secondary to  significant discomfort.  The pain is in the anterior shoulder.  It  radiates into the deltoid and triceps area.  He reports that it has been  difficult to sleep on the arm.   PAST MEDICAL HISTORY:  1. Hyperlipidemia.  2. Type 2 diabetes.  3. Hypertension.  4. Left kidney cyst that required drainage in 2005.   MEDICATIONS:  1. Glucophage 500 mg b.i.d., previously recommended by his physician      to take 1000 mg b.i.d.  2. Lisinopril and HCTZ 20/12.5.   ALLERGIES:  NONE.  HE HAD SEVERE BACK PAIN WHILE TAKING LIPITOR.   FAMILY HISTORY:  Mother passed away with a history of a CVA.  Father  passed away with a history of coronary artery disease and borderline  type 2 diabetes.  He has one brother with type 2 diabetes.  He has one  sister who is alive and well.   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  He is married with no children.  He denies any tobacco use and drinks  wine twice a month.   REVIEW OF SYSTEMS:  As per HPI; otherwise, unremarkable.   HEALTH MAINTENANCE:  The patient has not had a colonoscopy.  He is aware  of the reasons to have a colonoscopy but declines at this point.  PSA  was done last year per patient.   OBJECTIVE:  VITAL SIGNS:  Weight 215.  Pulse 76, blood pressure 130/82.  GENERAL:  Pleasant male in no acute distress.  Asks questions  appropriately.  Alert and oriented x3.  HEENT:  Normocephalic, atraumatic.  Pupils are equal, round, and  reactive to light.  Fundi was benign.  NECK:  Supple.  No lymphadenopathy, carotid bruits, or JVD.  No  thyromegaly.  LUNGS:  Clear.  HEART:  Regular rate and rhythm.  Normal S1 and S2.  No murmurs,  gallops, or rubs.  EXTREMITIES:  No clubbing, cyanosis, or edema.  Sensation intact.  The  patient had a scaly lesion about 0.5 cm in diameter just above the left  heel.  Per patient, recurrent in nature.  Examination of the left  shoulder significant for no obvious deformities on visual inspection.  Passive abduction of the arm causes significant discomfort after 85  degrees.  Internal and external rotation of the shoulder causes  significant pain in the anterior shoulder into the deltoid and triceps  area.  The patient has  a positive Neer impingement sign.   IMPRESSION:  1. Type 2 diabetes.  2. Hyperlipidemia.  3. Hypertension.  4. Left shoulder pain.  5. Atypical skin lesion on the left leg.   PLAN:  1. In regards to his diabetes, the patient revealed that he prefers      not to take a lot of medications and has adjusted his own      Glucophage to 500 mg b.i.d. as opposed to the recommended 1000 mg      b.i.d.  He did agree to go back to the 1000 mg b.i.d.      Additionally, he agrees to have a hemoglobin A1C reassessed today.      His previous hemoglobin A1C in September of last year was about 6.2      based on the review of records.  2. In regards to his hyperlipidemia, the patient is very hesitant to      take any medications.  He used to be on Lipitor as previously      stated.  He was also recommended Zetia, but he declined.  He is      aware that his LDL is not at goal, with his most recent one done in      December by an insurance company.  At that point, his LDL was 112.      The patient prefers to wait until June when his physical      examination is due to reassess.   The patient is aware of the risks.  3. Hypertension.  Stable on current medication regimen.  4. In regards to his left shoulder, further evaluation is warranted.      Will refer the patient for an x-ray to rule out any obvious      pathology.  X-ray will be done at [facility].      Subsequently, I anticipate that we will have to order an MRI to      assess the rotator cuff muscles.  Further recommendations after the      above is done.  The patient expressed understanding.   ADDENDUM:  In regards to the skin lesion that was noted on his left leg  I recommended surgical biopsy/excision for further evaluation. The  patient stated that he will schedule an appointment as early as  convenient.     Leanne Chang, M.D.  Electronically Signed    LA/MedQ  DD: 04/24/2006  DT: 04/24/2006  Job #: [id number]

## 2010-08-20 NOTE — Assessment & Plan Note (Signed)
[facility]                        [facility] [facility] OFFICE NOTE   NAME:John Beasley, John H  JR.                    MRN:          [id number]  DATE:04/24/2006                            DOB:          14-Dec-1950    ADDENDUM   In regards to the skin lesion that was noted on his left leg I  recommended surgical biopsy/excision for further evaluation. The patient  stated that he will schedule an appointment as early as convenient.     Leanne Chang, M.D.     LA/MedQ  DD: 04/24/2006  DT: 04/24/2006  Job #: [id number]

## 2010-10-13 ENCOUNTER — Other Ambulatory Visit: Payer: Self-pay | Admitting: Family Medicine

## 2010-10-28 ENCOUNTER — Ambulatory Visit (INDEPENDENT_AMBULATORY_CARE_PROVIDER_SITE_OTHER): Payer: BC Managed Care – PPO | Admitting: Family Medicine

## 2010-10-28 ENCOUNTER — Encounter: Payer: Self-pay | Admitting: Family Medicine

## 2010-10-28 DIAGNOSIS — E119 Type 2 diabetes mellitus without complications: Secondary | ICD-10-CM

## 2010-10-28 DIAGNOSIS — I10 Essential (primary) hypertension: Secondary | ICD-10-CM

## 2010-10-28 DIAGNOSIS — E785 Hyperlipidemia, unspecified: Secondary | ICD-10-CM

## 2010-10-28 LAB — CBC WITH DIFFERENTIAL/PLATELET
Basophils Absolute: 0 10*3/uL (ref 0.0–0.1)
Eosinophils Absolute: 0.1 10*3/uL (ref 0.0–0.7)
Lymphocytes Relative: 34.6 % (ref 12.0–46.0)
MCHC: 33.5 g/dL (ref 30.0–36.0)
Neutrophils Relative %: 58.1 % (ref 43.0–77.0)
RBC: 4.39 Mil/uL (ref 4.22–5.81)
RDW: 13.3 % (ref 11.5–14.6)

## 2010-10-28 LAB — POCT URINALYSIS DIPSTICK
Bilirubin, UA: NEGATIVE
Blood, UA: NEGATIVE
Ketones, UA: NEGATIVE
Leukocytes, UA: NEGATIVE
pH, UA: 5

## 2010-10-28 LAB — LIPID PANEL
HDL: 40.7 mg/dL (ref >39.00)
LDL Cholesterol: 110 mg/dL — ABNORMAL HIGH (ref 0–99)
Total CHOL/HDL Ratio: 4
Triglycerides: 155 mg/dL — ABNORMAL HIGH (ref 0.0–149.0)
VLDL: 31 mg/dL (ref 0.0–40.0)

## 2010-10-28 LAB — HEPATIC FUNCTION PANEL
Alkaline Phosphatase: 45 U/L (ref 39–117)
Bilirubin, Direct: 0.1 mg/dL (ref 0.0–0.3)

## 2010-10-28 LAB — BASIC METABOLIC PANEL
CO2: 26 mEq/L (ref 19–32)
Calcium: 9 mg/dL (ref 8.4–10.5)
Creatinine, Ser: 0.8 mg/dL (ref 0.4–1.5)
GFR: 102.69 mL/min (ref >60.00)
Glucose, Bld: 138 mg/dL — ABNORMAL HIGH (ref 70–99)

## 2010-10-28 MED ORDER — LISINOPRIL-HYDROCHLOROTHIAZIDE 20-12.5 MG PO TABS: 2.0000 | ORAL_TABLET | Freq: Every day | ORAL | Status: DC

## 2010-10-28 MED ORDER — METFORMIN HCL 1000 MG PO TABS: 1000.0000 mg | ORAL_TABLET | Freq: Two times a day (BID) | ORAL | Status: DC

## 2010-10-28 NOTE — Patient Instructions (Signed)
Diabetes and Exercise Regular exercise is important and can help:   Control blood glucose (sugar).   Decrease blood pressure.   Control blood lipids (cholesterol and triglycerides).   Improve overall health.  BENEFITS FROM EXERCISE:  Improved fitness.   Improved flexibility.   Improved endurance.   Increased bone density.   Weight control.   Increased muscle strength.   Decreased body fat.   Improvement of the body's use of a hormone called insulin.   Increased insulin sensitivity.   Reduction of insulin needs.   Helps you feel better.   Reduces stress and tension.  People with diabetes who add exercise to their lifestyle gain additional benefits.   Weight loss.   Reduces appetite.   Improves body's use of blood glucose (sugar).   Decreases risk factors for heart disease:   Lowering of cholesterol and triglycerides.   Raising the level of good cholesterol (high-density lipoproteins [HDL]).   Lowering blood sugar.   Decreases blood pressure.  TYPE 1 DIABETES AND EXERCISE  Exercise will usually lower your blood glucose.   If blood glucose is greater than 240 mg/dl, check urine ketones. If ketones are present, do not exercise.   Location of the insulin injection sites may need to be adjusted with exercise. Avoid injecting insulin into areas of the body that will be exercised. For example, avoid injecting insulin into:   The arms when playing tennis.   The legs when jogging. For more information, discuss this with your caregiver.   Keep a record of:   Food intake.   Type and amount of exercise.   Expected peak times of insulin action.   Blood glucose (sugar) levels.  Do this before, during and after exercise. Review your records with your caregiver(s). This will help you to develop guidelines for adjusting food intake and/or insulin amounts.  TYPE 2 DIABETES AND EXERCISE  Regular physical activity can help control blood glucose.   Exercise is  important because it may:   Increase the body's sensitivity to insulin.   Improve blood glucose control.   Exercise reduces the risk of heart disease. It decreases serum cholesterol and triglycerides. It also lowers blood pressure.   Those who take insulin or oral hypoglycemic agents should watch for signs of hypoglycemia. These signs include dizziness, shaking, sweating, chills and confusion.   Body water is lost during exercise. It must be replaced. This will help to avoid loss of body fluids (dehydration) and/or heat stroke.  Be sure to talk to your caregiver before starting an exercise program to make sure it is safe for you. Remember, any activity is better than none.  Document Released: 06/11/2003 Document Re-Released: 01/16/2009 Exit[employer] Patient Information 2011 [employer]. 

## 2010-10-28 NOTE — Progress Notes (Signed)
  Subjective:     John Beasley is a 60 y.o. male who presents for follow up of diabetes.. Current symptoms include: none. Patient denies foot ulcerations, hyperglycemia, hypoglycemia , increased appetite, nausea, paresthesia of the feet, polydipsia, polyuria, visual disturbances, vomiting and weight loss. Evaluation to date has been: fasting blood sugar, fasting lipid panel, hemoglobin A1C and microalbuminuria. Home sugars: BGs consistently in an acceptable range. Current treatments: Continued metformin which has been effective. Last dilated eye exam -10/2010.  The following portions of the patient's history were reviewed and updated as appropriate: allergies, current medications, past family history, past medical history, past social history, past surgical history and problem list.  Review of Systems Pertinent items are noted in HPI.    Objective:    BP 122/74  Pulse 54  Temp(Src) 97.9 F (36.6 C) (Oral)  Wt 202 lb 3.2 oz (91.717 kg)  SpO2 98% General appearance: alert, cooperative, appears stated age and no distress Head: Normocephalic, without obvious abnormality, atraumatic Eyes: conjunctivae/corneas clear. PERRL, EOM's intact. Fundi benign. Lungs: clear to auscultation bilaterally Heart: regular rate and rhythm, S1, S2 normal, no murmur, click, rub or gallop Extremities: extremities normal, atraumatic, no cyanosis or edema    @DMFOOTEXAM @  Patient was evaluated for proper footwear and sizing.  Laboratory: No components found with this basename: A1C      Assessment:    Diabetes mellitus Type II, under good control.   htn hyperlipidemia Plan:    Discussed general issues about diabetes pathophysiology and management. Counseling at today's visit: focused on the need for regular aerobic exercise, discussed the advantages of a diet low in carbohydrates and reminded to check sugars regularly and to bring readings in at the time of the next visit. Agricultural engineer  distributed. Addressed ADA diet. Discussed foot care. Continued metformin; see  medication orders. Continued ACE inhibitor; see medication orders. Labs: fasting blood sugar, fasting lipid panel, hemoglobin A1C and microalbuminuria. Reminded to bring in blood sugar diary at next visit. Follow up in 6 months or as needed.

## 2010-11-01 ENCOUNTER — Telehealth: Payer: Self-pay | Admitting: *Deleted

## 2010-11-01 ENCOUNTER — Encounter: Payer: Self-pay | Admitting: *Deleted

## 2010-11-01 MED ORDER — SAXAGLIPTIN-METFORMIN ER 5-1000 MG PO TB24: 1.0000 | ORAL_TABLET | Freq: Every day | ORAL | Status: DC

## 2010-11-01 MED ORDER — EZETIMIBE 10 MG PO TABS: 10.0000 mg | ORAL_TABLET | Freq: Every day | ORAL | Status: DC

## 2010-11-01 NOTE — Telephone Encounter (Signed)
Discuss with patient, Rx sent to pharmacy and copy of labs mailed.

## 2010-11-01 NOTE — Telephone Encounter (Signed)
Message copied by Verdene Rio on Mon Nov 01, 2010 11:18 AM ------      Message from: Lelon Perla      Created: Fri Oct 29, 2010  2:40 PM       Change metformin to kombiglyza 08/998 1 po qd  #30  2 refills      Cholesterol--- LDL goal < 70,  HDL >40,  TG < 150.  Diet and exercise will increase HDL and decrease LDL and TG.  Fish,  Fish Oil, Flaxseed oil will also help increase the HDL and decrease Triglycerides.  Try zetia 10 mg #30 1 po qd, 2 refills.     Recheck labs 3 months.-----250.00,  272.4   Lipid, hep, hgba1c, bmp

## 2011-01-17 ENCOUNTER — Other Ambulatory Visit: Payer: Self-pay | Admitting: Family Medicine

## 2011-01-17 MED ORDER — SAXAGLIPTIN-METFORMIN ER 5-1000 MG PO TB24: 1.0000 | ORAL_TABLET | Freq: Every day | ORAL | Status: DC

## 2011-01-17 NOTE — Telephone Encounter (Signed)
Rf sent

## 2011-02-14 ENCOUNTER — Other Ambulatory Visit (INDEPENDENT_AMBULATORY_CARE_PROVIDER_SITE_OTHER): Payer: BC Managed Care – PPO

## 2011-02-14 DIAGNOSIS — E119 Type 2 diabetes mellitus without complications: Secondary | ICD-10-CM

## 2011-02-14 DIAGNOSIS — E785 Hyperlipidemia, unspecified: Secondary | ICD-10-CM

## 2011-02-14 NOTE — Progress Notes (Signed)
12  

## 2011-02-15 LAB — LIPID PANEL
Cholesterol: 183 mg/dL (ref 0–200)
HDL: 37.7 mg/dL — ABNORMAL LOW (ref >39.00)
LDL Cholesterol: 109 mg/dL — ABNORMAL HIGH (ref 0–99)
Triglycerides: 183 mg/dL — ABNORMAL HIGH (ref 0.0–149.0)

## 2011-02-15 LAB — HEPATIC FUNCTION PANEL
AST: 41 U/L — ABNORMAL HIGH (ref 0–37)
Albumin: 4.1 g/dL (ref 3.5–5.2)
Alkaline Phosphatase: 43 U/L (ref 39–117)
Total Protein: 7.4 g/dL (ref 6.0–8.3)

## 2011-02-15 LAB — HEMOGLOBIN A1C: Hgb A1c MFr Bld: 7.6 % — ABNORMAL HIGH (ref 4.6–6.5)

## 2011-02-23 ENCOUNTER — Ambulatory Visit (INDEPENDENT_AMBULATORY_CARE_PROVIDER_SITE_OTHER): Payer: BC Managed Care – PPO | Admitting: Family Medicine

## 2011-02-23 ENCOUNTER — Encounter: Payer: Self-pay | Admitting: Family Medicine

## 2011-02-23 DIAGNOSIS — E119 Type 2 diabetes mellitus without complications: Secondary | ICD-10-CM

## 2011-02-23 DIAGNOSIS — E785 Hyperlipidemia, unspecified: Secondary | ICD-10-CM

## 2011-02-23 DIAGNOSIS — M5412 Radiculopathy, cervical region: Secondary | ICD-10-CM

## 2011-02-23 DIAGNOSIS — IMO0002 Reserved for concepts with insufficient information to code with codable children: Secondary | ICD-10-CM

## 2011-02-23 MED ORDER — GABAPENTIN 100 MG PO CAPS: 100.0000 mg | ORAL_CAPSULE | Freq: Three times a day (TID) | ORAL | Status: DC

## 2011-02-23 MED ORDER — SAXAGLIPTIN-METFORMIN ER 5-1000 MG PO TB24: 1.0000 | ORAL_TABLET | Freq: Every day | ORAL | Status: DC

## 2011-02-23 NOTE — Patient Instructions (Signed)

## 2011-02-23 NOTE — Progress Notes (Signed)
  Subjective:    John Beasley is a 60 y.o. male who presents with right shoulder pain. The symptoms began several weeks ago. Aggravating factors: no known event. Pain is located diffusely throughout the shoulder. Discomfort is described as aching. Symptoms are exacerbated by repetitive movements. Evaluation to date: none. Therapy to date includes: nothing specific.  The following portions of the patient's history were reviewed and updated as appropriate: allergies, current medications, past family history, past medical history, past social history, past surgical history and problem list.  Review of Systems Pertinent items are noted in HPI.   Objective:    BP 130/76  Pulse 76  Temp(Src) 97.9 F (36.6 C) (Oral)  Wt 206 lb (93.441 kg)  SpO2 97% Right shoulder: tenderness over the glenohumeral joint, distal neuromuscular exam negative and pain with rom   Left shoulder: normal active ROM, no tenderness, no impingement sign     Assessment:    Right shoulder pain   DMII---- pt is requesting to work on diet and exercise Cholesterol--pt will work on diet and exercise Plan:    Natural history and expected course discussed. Questions answered. Agricultural engineer distributed. Reduction in offending activity. Gentle ROM exercises. Rest, ice, compression, and elevation (RICE) therapy. NSAIDs per medication orders. refer to ortho if no improvement

## 2011-07-08 ENCOUNTER — Other Ambulatory Visit (INDEPENDENT_AMBULATORY_CARE_PROVIDER_SITE_OTHER): Payer: BC Managed Care – PPO

## 2011-07-08 DIAGNOSIS — E119 Type 2 diabetes mellitus without complications: Secondary | ICD-10-CM

## 2011-07-08 DIAGNOSIS — E785 Hyperlipidemia, unspecified: Secondary | ICD-10-CM

## 2011-07-08 LAB — LIPID PANEL: Total CHOL/HDL Ratio: 4

## 2011-07-08 LAB — HEPATIC FUNCTION PANEL
AST: 32 U/L (ref 0–37)
Alkaline Phosphatase: 42 U/L (ref 39–117)
Total Bilirubin: 0.4 mg/dL (ref 0.3–1.2)

## 2011-07-08 LAB — BASIC METABOLIC PANEL
BUN: 18 mg/dL (ref 6–23)
CO2: 28 mEq/L (ref 19–32)
Calcium: 9.2 mg/dL (ref 8.4–10.5)
Creatinine, Ser: 0.9 mg/dL (ref 0.4–1.5)
Glucose, Bld: 148 mg/dL — ABNORMAL HIGH (ref 70–99)

## 2011-07-15 MED ORDER — GLIMEPIRIDE 2 MG PO TABS: 2.0000 mg | ORAL_TABLET | Freq: Every day | ORAL | Status: DC

## 2011-07-19 ENCOUNTER — Other Ambulatory Visit: Payer: Self-pay | Admitting: *Deleted

## 2011-07-19 MED ORDER — SAXAGLIPTIN-METFORMIN ER 5-1000 MG PO TB24: 1.0000 | ORAL_TABLET | Freq: Every day | ORAL | Status: DC

## 2011-11-09 ENCOUNTER — Encounter: Payer: Self-pay | Admitting: Family Medicine

## 2011-11-09 ENCOUNTER — Telehealth: Payer: Self-pay | Admitting: Family Medicine

## 2011-11-09 ENCOUNTER — Ambulatory Visit (INDEPENDENT_AMBULATORY_CARE_PROVIDER_SITE_OTHER): Payer: BC Managed Care – PPO | Admitting: Family Medicine

## 2011-11-09 VITALS — BP 124/70 | HR 75 | Temp 97.9°F | Wt 205.2 lb

## 2011-11-09 DIAGNOSIS — E1165 Type 2 diabetes mellitus with hyperglycemia: Secondary | ICD-10-CM

## 2011-11-09 DIAGNOSIS — IMO0001 Reserved for inherently not codable concepts without codable children: Secondary | ICD-10-CM

## 2011-11-09 DIAGNOSIS — I10 Essential (primary) hypertension: Secondary | ICD-10-CM

## 2011-11-09 DIAGNOSIS — Z0000 Encounter for general adult medical examination without abnormal findings: Secondary | ICD-10-CM

## 2011-11-09 LAB — POCT URINALYSIS DIPSTICK
Bilirubin, UA: NEGATIVE
Blood, UA: NEGATIVE
Glucose, UA: NEGATIVE
Ketones, UA: NEGATIVE
Leukocytes, UA: NEGATIVE
Nitrite, UA: NEGATIVE

## 2011-11-09 LAB — LIPID PANEL
Cholesterol: 174 mg/dL (ref 0–200)
Triglycerides: 251 mg/dL — ABNORMAL HIGH (ref 0.0–149.0)
VLDL: 50.2 mg/dL — ABNORMAL HIGH (ref 0.0–40.0)

## 2011-11-09 LAB — HEPATIC FUNCTION PANEL
AST: 46 U/L — ABNORMAL HIGH (ref 0–37)
Albumin: 4 g/dL (ref 3.5–5.2)

## 2011-11-09 LAB — HEMOGLOBIN A1C: Hgb A1c MFr Bld: 6.4 % (ref 4.6–6.5)

## 2011-11-09 LAB — MICROALBUMIN / CREATININE URINE RATIO: Microalb Creat Ratio: 0.4 mg/g (ref 0.0–30.0)

## 2011-11-09 LAB — BASIC METABOLIC PANEL
BUN: 18 mg/dL (ref 6–23)
Calcium: 9.1 mg/dL (ref 8.4–10.5)
Chloride: 102 mEq/L (ref 96–112)
Creatinine, Ser: 1.1 mg/dL (ref 0.4–1.5)

## 2011-11-09 MED ORDER — LISINOPRIL-HYDROCHLOROTHIAZIDE 20-12.5 MG PO TABS: 2.0000 | ORAL_TABLET | Freq: Every day | ORAL | Status: DC

## 2011-11-09 MED ORDER — GLIMEPIRIDE 2 MG PO TABS: 2.0000 mg | ORAL_TABLET | Freq: Every day | ORAL | Status: DC

## 2011-11-09 NOTE — Progress Notes (Signed)
  Subjective:     John Beasley is a 61 y.o. male who presents for follow up of diabetes.. Current symptoms include: hyperglycemia. Patient denies foot ulcerations, hypoglycemia , increased appetite, nausea, paresthesia of the feet, polydipsia, polyuria, visual disturbances, vomiting and weight loss. Evaluation to date has been: fasting blood sugar, fasting lipid panel, hemoglobin A1C and microalbuminuria. Home sugars: BGs are high in the morning. Current treatments: Continued sulfonylurea which has been somewhat effective, Continued metformin which has been effective and Continued kombiglyza which has been effective. Last dilated eye exam 1-2 weeks ago.  The following portions of the patient's history were reviewed and updated as appropriate: allergies, current medications, past family history, past medical history, past social history, past surgical history and problem list.  Review of Systems Pertinent items are noted in HPI.    Objective:    BP 124/70  Pulse 75  Temp 97.9 F (36.6 C) (Oral)  Wt 205 lb 3.2 oz (93.078 kg)  SpO2 97% General appearance: alert, cooperative, appears stated age and no distress Lungs: clear to auscultation bilaterally Heart: S1, S2 normal Extremities: extremities normal, atraumatic, no cyanosis or edema              Sensory exam of the foot is normal, tested with the monofilament. Good pulses, no lesions or ulcers, good peripheral pulses.  Patient was not evaluated for proper footwear and sizing.  Laboratory: Lab Results  Component Value Date   HGBA1C 7.0* 07/08/2011       Assessment:    Diabetes mellitus Type II, under fair control.   hypertension-- con't meds--stable Plan:    Discussed general issues about diabetes pathophysiology and management. Discussed foot care. Reminded to get yearly retinal exam. Continued sulfonylurea; see medication orders.  Continued metformin; see  medication orders. Follow up in 3 months or as needed.

## 2011-11-09 NOTE — Telephone Encounter (Signed)
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  He states he is unable to do this until next summer, but does not want to schedule out that far right now.

## 2011-11-09 NOTE — Patient Instructions (Addendum)

## 2011-11-12 ENCOUNTER — Other Ambulatory Visit: Payer: Self-pay | Admitting: Family Medicine

## 2011-11-12 DIAGNOSIS — E119 Type 2 diabetes mellitus without complications: Secondary | ICD-10-CM

## 2011-11-12 DIAGNOSIS — E785 Hyperlipidemia, unspecified: Secondary | ICD-10-CM

## 2011-11-14 ENCOUNTER — Encounter: Payer: Self-pay | Admitting: *Deleted

## 2011-11-14 ENCOUNTER — Other Ambulatory Visit: Payer: Self-pay | Admitting: Family Medicine

## 2011-12-28 ENCOUNTER — Encounter: Payer: Self-pay | Admitting: Family Medicine

## 2011-12-28 DIAGNOSIS — I10 Essential (primary) hypertension: Secondary | ICD-10-CM

## 2011-12-28 DIAGNOSIS — E1165 Type 2 diabetes mellitus with hyperglycemia: Secondary | ICD-10-CM

## 2011-12-29 MED ORDER — GLIMEPIRIDE 2 MG PO TABS: 2.0000 mg | ORAL_TABLET | Freq: Every day | ORAL | Status: DC

## 2011-12-29 MED ORDER — LISINOPRIL-HYDROCHLOROTHIAZIDE 20-12.5 MG PO TABS: 2.0000 | ORAL_TABLET | Freq: Every day | ORAL | Status: DC

## 2011-12-29 MED ORDER — SAXAGLIPTIN-METFORMIN ER 5-1000 MG PO TB24: 1.0000 | ORAL_TABLET | Freq: Every day | ORAL | Status: DC

## 2011-12-29 NOTE — Addendum Note (Signed)
Addended by: Arnette Norris on: 12/29/2011 05:23 PM   Modules accepted: Orders

## 2012-01-17 ENCOUNTER — Other Ambulatory Visit: Payer: Self-pay | Admitting: Family Medicine

## 2012-01-17 NOTE — Telephone Encounter (Signed)
Spoke with pharmacy. They stated that they did not received the rx sent in on 9.26.13. Re-sent rx.

## 2012-05-19 ENCOUNTER — Other Ambulatory Visit: Payer: Self-pay

## 2012-06-18 ENCOUNTER — Encounter: Payer: Self-pay | Admitting: Lab

## 2012-06-19 ENCOUNTER — Ambulatory Visit (INDEPENDENT_AMBULATORY_CARE_PROVIDER_SITE_OTHER): Payer: BC Managed Care – PPO | Admitting: Family Medicine

## 2012-06-19 ENCOUNTER — Encounter: Payer: Self-pay | Admitting: Family Medicine

## 2012-06-19 VITALS — BP 110/62 | HR 84 | Wt 206.0 lb

## 2012-06-19 DIAGNOSIS — E785 Hyperlipidemia, unspecified: Secondary | ICD-10-CM

## 2012-06-19 DIAGNOSIS — E119 Type 2 diabetes mellitus without complications: Secondary | ICD-10-CM

## 2012-06-19 DIAGNOSIS — IMO0002 Reserved for concepts with insufficient information to code with codable children: Secondary | ICD-10-CM

## 2012-06-19 DIAGNOSIS — E118 Type 2 diabetes mellitus with unspecified complications: Secondary | ICD-10-CM

## 2012-06-19 DIAGNOSIS — E1165 Type 2 diabetes mellitus with hyperglycemia: Secondary | ICD-10-CM

## 2012-06-19 DIAGNOSIS — I10 Essential (primary) hypertension: Secondary | ICD-10-CM

## 2012-06-19 DIAGNOSIS — IMO0001 Reserved for inherently not codable concepts without codable children: Secondary | ICD-10-CM

## 2012-06-19 LAB — MICROALBUMIN / CREATININE URINE RATIO: Creatinine,U: 183.3 mg/dL

## 2012-06-19 LAB — HEPATIC FUNCTION PANEL
Albumin: 4.3 g/dL (ref 3.5–5.2)
Alkaline Phosphatase: 47 U/L (ref 39–117)
Total Bilirubin: 0.7 mg/dL (ref 0.3–1.2)

## 2012-06-19 LAB — HEMOGLOBIN A1C: Hgb A1c MFr Bld: 7.2 % — ABNORMAL HIGH (ref 4.6–6.5)

## 2012-06-19 LAB — POCT URINALYSIS DIPSTICK
Bilirubin, UA: NEGATIVE
Blood, UA: NEGATIVE
Glucose, UA: NEGATIVE
Spec Grav, UA: 1.02
pH, UA: 6.5

## 2012-06-19 LAB — BASIC METABOLIC PANEL
BUN: 17 mg/dL (ref 6–23)
Chloride: 98 mEq/L (ref 96–112)
GFR: 71.99 mL/min (ref >60.00)
Potassium: 4 mEq/L (ref 3.5–5.1)
Sodium: 133 mEq/L — ABNORMAL LOW (ref 135–145)

## 2012-06-19 LAB — LIPID PANEL
HDL: 28 mg/dL — ABNORMAL LOW (ref >39.00)
Total CHOL/HDL Ratio: 7
VLDL: 47.4 mg/dL — ABNORMAL HIGH (ref 0.0–40.0)

## 2012-06-19 LAB — LDL CHOLESTEROL, DIRECT: Direct LDL: 116.6 mg/dL

## 2012-06-19 MED ORDER — LISINOPRIL-HYDROCHLOROTHIAZIDE 20-12.5 MG PO TABS: 2.0000 | ORAL_TABLET | Freq: Every day | ORAL | Status: DC

## 2012-06-19 MED ORDER — SAXAGLIPTIN-METFORMIN ER 5-1000 MG PO TB24: 1.0000 | ORAL_TABLET | Freq: Every day | ORAL | Status: DC

## 2012-06-19 MED ORDER — GLIMEPIRIDE 2 MG PO TABS: 2.0000 mg | ORAL_TABLET | Freq: Every day | ORAL | Status: DC

## 2012-06-19 NOTE — Progress Notes (Unsigned)
Patient was asked to sign Controled Substance Contract for Neurontin.  He wanted contract to state the name of the medication.  Lab Tech took patient to [place] office.  As Dois Davenport had another patient waiting in the Nurse Triage room for a shot, she asked Amy/me to escort/talk with the patient to the lobby, and she would assist him in a few minutes.  Dois Davenport entered her office, and asked the patient to please wait in the lobby for a few minutes since she had another patient in front of him.  Before she could advise him who I was, and Amy- that we were trying to assist, he said, "I won't take up any of your time.  I'm not taking the test or signing the contract, just take me off the Neurontin."  He was walking out of Sandra's office as he was saying this, and did not give [place] an opportunity to help or explain.

## 2012-06-19 NOTE — Progress Notes (Signed)
  Subjective:    Patient ID: John Beasley, male    DOB: 02-Jan-1951, 62 y.o.   MRN: [id number]  HPI HYPERTENSION Disease Monitoring Blood pressure range-not checking at home Chest pain- no      Dyspnea- no Medications Compliance- goo Lightheadedness- no   Edema- no   DIABETES Disease Monitoring Blood Sugar ranges-not checking regularly Polyuria- no New Visual problems- no Medications Compliance- good Hypoglycemic symptoms- no   HYPERLIPIDEMIA Disease Monitoring See symptoms for Hypertension Medications Compliance- good RUQ pain- no  Muscle aches- no  ROS See HPI above   PMH Smoking Status noted     Review of Systems    as above Objective:   Physical Exam BP 110/62  Pulse 84  Wt 206 lb (93.441 kg)  BMI 29.56 kg/m2  SpO2 98% General appearance: alert, cooperative, appears stated age and no distress Lungs: clear to auscultation bilaterally Heart: S1, S2 normal Extremities: extremities normal, atraumatic, no cyanosis or edema Sensory exam of the foot is normal, tested with the monofilament. Good pulses, no lesions or ulcers, good peripheral pulses.    +? Hematoma under R big toe----but pt states its been there 4 months---       Assessment & Plan:

## 2012-06-19 NOTE — Assessment & Plan Note (Signed)
Stable Cont meds 

## 2012-06-19 NOTE — Patient Instructions (Addendum)
Diabetes and Standards of Medical Care  Diabetes is complicated. You may find that your diabetes team includes a dietitian, nurse, diabetes educator, eye doctor, and more. To help everyone know what is going on and to help you get the care you deserve, the following schedule of care was developed to help keep you on track. Below are the tests, exams, vaccines, medicines, education, and plans you will need. A1c test  Performed at least 2 times a year if you are meeting treatment goals.  Performed 4 times a year if therapy has changed or if you are not meeting therapy/glycemic goals. Aspirin medicine  Take daily as directed by your caregiver. Blood pressure test  Performed at every routine medical visit. The goal is less than 130/80 mm/Hg. Dental exam  Get a dental exam at least 2 times a year. Dilated eye exam (retinal exam)  Type 1 diabetes: Get an exam within 5 years of diagnosis and then yearly.  Type 2 diabetes: Get an exam at diagnosis and then yearly. All exams thereafter can be extended to every 2 to 3 years if one or more exams have been normal. Foot care exam  Visual foot exams are performed at every routine medical visit. The exams check for cuts, injuries, or other problems with the feet.  A comprehensive foot exam should be done yearly. This includes visual inspection as well as assessing foot pulses and testing for loss of sensation. Kidney function test (urine microalbumin)  Performed once a year.  Type 1 diabetes: The first test is performed 5 years after diagnosis.  Type 2 diabetes: The first test is performed at the time of diagnosis.  A serum creatinine and estimated glomerular filtration rate (eGFR) test is done once a year to tell the level of chronic kidney disease (CKD), if present. Lipid profile (Cholesterol, HDL, LDL, Triglycerides)  Performed once a year for most people. If at low risk, may be assessed every 2 years.  The goal for LDL is less than 100  mg/dl. If at high risk, the goal is less than 70 mg/dl.  The goal for HDL is higher than 40 mg/dl for men and higher than 50 mg/dl for women.  The goal for triglycerides is less than 150 mg/dl. Flu vaccine, pneumonia vaccine, and hepatitis B vaccine  The flu vaccine is recommended yearly.  The pneumonia vaccine is generally given once in a lifetime. However, there are some instances where another vaccine is recommended. Check with your caregiver.  The hepatitis B vaccine is also recommended for adults with diabetes. Diabetes self-management education  Recommended at diagnosis and ongoing as needed. Treatment plan  Reviewed at every medical visit. Document Released: 01/16/2009 Document Revised: 06/13/2011 Document Reviewed: 09/21/2010 Exit[employer] Patient Information 2013 [employer].  

## 2012-06-19 NOTE — Assessment & Plan Note (Addendum)
Check labs today con't meds Hematoma R big toe nail---- if it does not resolve -- advised pt to show derm at next appointment

## 2012-06-19 NOTE — Assessment & Plan Note (Signed)
Check labs con't meds 

## 2012-06-21 ENCOUNTER — Telehealth: Payer: Self-pay | Admitting: *Deleted

## 2012-06-21 ENCOUNTER — Encounter: Payer: Self-pay | Admitting: Family Medicine

## 2012-06-21 MED ORDER — SAXAGLIPTIN-METFORMIN ER 2.5-1000 MG PO TB24: 2.0000 | ORAL_TABLET | Freq: Every day | ORAL | Status: DC

## 2012-06-21 NOTE — Telephone Encounter (Signed)
Rx sent 

## 2012-06-21 NOTE — Telephone Encounter (Signed)
i want to increse the dose   2.08/998  2 tabs a day

## 2012-06-21 NOTE — Telephone Encounter (Addendum)
Notes Recorded by Lelon Perla, DO on 06/19/2012 at 5:13 PM Increase kombiglyza 2.5/ 1000 2 po qpm #60 2 refills--- recheck 3 months---uncontrolled dm II =-- bmp, hgba1c, microalbumin  Message copied by Verdene Rio on Thu Jun 21, 2012 12:59 PM ------      Message from: Henrietta Hoover      Created: Thu Jun 21, 2012 11:53 AM       Discuss with patient, Grover Canavan ------

## 2012-06-21 NOTE — Telephone Encounter (Addendum)
Pt is doing kombiglyza 08/998 not 2.08/998 do you still want to increase to 2 tab .Please advise

## 2012-10-11 ENCOUNTER — Other Ambulatory Visit: Payer: Self-pay

## 2012-10-15 ENCOUNTER — Encounter: Payer: Self-pay | Admitting: Family Medicine

## 2012-10-15 MED ORDER — SAXAGLIPTIN-METFORMIN ER 2.5-1000 MG PO TB24: 2.0000 | ORAL_TABLET | Freq: Every day | ORAL | Status: DC

## 2013-02-05 ENCOUNTER — Ambulatory Visit (INDEPENDENT_AMBULATORY_CARE_PROVIDER_SITE_OTHER): Payer: BC Managed Care – PPO | Admitting: Family Medicine

## 2013-02-05 ENCOUNTER — Encounter: Payer: Self-pay | Admitting: Family Medicine

## 2013-02-05 VITALS — BP 116/70 | HR 65 | Temp 97.6°F | Wt 205.0 lb

## 2013-02-05 DIAGNOSIS — E1159 Type 2 diabetes mellitus with other circulatory complications: Secondary | ICD-10-CM

## 2013-02-05 DIAGNOSIS — I10 Essential (primary) hypertension: Secondary | ICD-10-CM

## 2013-02-05 DIAGNOSIS — Z0000 Encounter for general adult medical examination without abnormal findings: Secondary | ICD-10-CM

## 2013-02-05 DIAGNOSIS — E785 Hyperlipidemia, unspecified: Secondary | ICD-10-CM

## 2013-02-05 DIAGNOSIS — E119 Type 2 diabetes mellitus without complications: Secondary | ICD-10-CM

## 2013-02-05 LAB — HEPATIC FUNCTION PANEL
AST: 78 U/L — ABNORMAL HIGH (ref 0–37)
Albumin: 3.9 g/dL (ref 3.5–5.2)

## 2013-02-05 LAB — BASIC METABOLIC PANEL
BUN: 14 mg/dL (ref 6–23)
Calcium: 9.2 mg/dL (ref 8.4–10.5)
Creatinine, Ser: 1.1 mg/dL (ref 0.4–1.5)
GFR: 75.76 mL/min (ref >60.00)

## 2013-02-05 LAB — HEMOGLOBIN A1C: Hgb A1c MFr Bld: 7 % — ABNORMAL HIGH (ref 4.6–6.5)

## 2013-02-05 LAB — LIPID PANEL
Cholesterol: 194 mg/dL (ref 0–200)
HDL: 34.6 mg/dL — ABNORMAL LOW (ref >39.00)
Triglycerides: 170 mg/dL — ABNORMAL HIGH (ref 0.0–149.0)
VLDL: 34 mg/dL (ref 0.0–40.0)

## 2013-02-05 MED ORDER — EZETIMIBE 10 MG PO TABS: 10.0000 mg | ORAL_TABLET | Freq: Every day | ORAL | Status: DC

## 2013-02-05 MED ORDER — SAXAGLIPTIN-METFORMIN ER 2.5-1000 MG PO TB24: 2.0000 | ORAL_TABLET | Freq: Every day | ORAL | Status: DC

## 2013-02-05 NOTE — Assessment & Plan Note (Signed)
Check labs 

## 2013-02-05 NOTE — Assessment & Plan Note (Signed)
con't meds  Check labs 

## 2013-02-05 NOTE — Patient Instructions (Addendum)

## 2013-02-05 NOTE — Progress Notes (Signed)
  Subjective:    Patient ID: John Beasley, male    DOB: 07/28/1950, 62 y.o.   MRN: [id number]  HPI HYPERTENSION Disease Monitoring Blood pressure range-not checking Chest pain- no      Dyspnea- no Medications Compliance- good Lightheadedness- no   Edema- no   DIABETES Disease Monitoring Blood Sugar ranges-good Polyuria- no New Visual problems- no Medications Compliance- good Hypoglycemic symptoms- no   HYPERLIPIDEMIA Disease Monitoring See symptoms for Hypertension Medications Compliance- not on any RUQ pain- no  Muscle aches- no  ROS See HPI above   PMH Smoking Status noted     Review of Systems As above    Objective:   Physical Exam BP 116/70  Pulse 65  Temp(Src) 97.6 F (36.4 C) (Oral)  Wt 205 lb (92.987 kg)  SpO2 98% General appearance: alert, cooperative, appears stated age and no distress Neck: no adenopathy, no carotid bruit, no JVD, supple, symmetrical, trachea midline and thyroid not enlarged, symmetric, no tenderness/mass/nodules Lungs: clear to auscultation bilaterally Heart: S1, S2 normal Extremities: extremities normal, atraumatic, no cyanosis or edema Sensory exam of the foot is normal, tested with the monofilament. Good pulses, no lesions or ulcers, good peripheral pulses.        Assessment & Plan:

## 2013-02-05 NOTE — Assessment & Plan Note (Signed)
Stable con't meds 

## 2013-02-06 LAB — POCT URINALYSIS DIPSTICK
Blood, UA: NEGATIVE
Ketones, UA: NEGATIVE
Leukocytes, UA: NEGATIVE
Nitrite, UA: NEGATIVE
Protein, UA: NEGATIVE
Urobilinogen, UA: 0.2
pH, UA: 6

## 2013-02-07 ENCOUNTER — Other Ambulatory Visit: Payer: Self-pay | Admitting: Family Medicine

## 2013-02-07 DIAGNOSIS — R748 Abnormal levels of other serum enzymes: Secondary | ICD-10-CM

## 2013-02-08 ENCOUNTER — Other Ambulatory Visit: Payer: Self-pay

## 2013-02-10 ENCOUNTER — Encounter: Payer: Self-pay | Admitting: Family Medicine

## 2013-02-12 ENCOUNTER — Ambulatory Visit (HOSPITAL_BASED_OUTPATIENT_CLINIC_OR_DEPARTMENT_OTHER)
Admission: RE | Admit: 2013-02-12 | Discharge: 2013-02-12 | Disposition: A | Discharge status: Home / Self Care | Payer: BC Managed Care – PPO | Source: Ambulatory Visit | Attending: Family Medicine | Admitting: Family Medicine

## 2013-02-12 ENCOUNTER — Other Ambulatory Visit: Payer: Self-pay | Admitting: Family Medicine

## 2013-02-12 DIAGNOSIS — N281 Cyst of kidney, acquired: Secondary | ICD-10-CM

## 2013-02-12 DIAGNOSIS — Q619 Cystic kidney disease, unspecified: Secondary | ICD-10-CM | POA: Insufficient documentation

## 2013-02-12 DIAGNOSIS — R945 Abnormal results of liver function studies: Secondary | ICD-10-CM | POA: Insufficient documentation

## 2013-02-12 DIAGNOSIS — R748 Abnormal levels of other serum enzymes: Secondary | ICD-10-CM

## 2013-02-12 DIAGNOSIS — K8020 Calculus of gallbladder without cholecystitis without obstruction: Secondary | ICD-10-CM | POA: Insufficient documentation

## 2013-02-12 DIAGNOSIS — K7689 Other specified diseases of liver: Secondary | ICD-10-CM | POA: Insufficient documentation

## 2013-02-14 ENCOUNTER — Other Ambulatory Visit: Payer: Self-pay | Admitting: *Deleted

## 2013-02-14 DIAGNOSIS — N281 Cyst of kidney, acquired: Secondary | ICD-10-CM

## 2013-02-22 ENCOUNTER — Encounter: Payer: Self-pay | Admitting: Family Medicine

## 2013-03-25 ENCOUNTER — Encounter: Payer: Self-pay | Admitting: Family Medicine

## 2013-04-29 ENCOUNTER — Ambulatory Visit (INDEPENDENT_AMBULATORY_CARE_PROVIDER_SITE_OTHER): Payer: BC Managed Care – PPO | Admitting: Family Medicine

## 2013-04-29 ENCOUNTER — Encounter: Payer: Self-pay | Admitting: Family Medicine

## 2013-04-29 VITALS — BP 138/80 | HR 81 | Temp 97.6°F | Wt 205.0 lb

## 2013-04-29 DIAGNOSIS — E119 Type 2 diabetes mellitus without complications: Secondary | ICD-10-CM

## 2013-04-29 DIAGNOSIS — IMO0001 Reserved for inherently not codable concepts without codable children: Secondary | ICD-10-CM

## 2013-04-29 DIAGNOSIS — E1165 Type 2 diabetes mellitus with hyperglycemia: Secondary | ICD-10-CM

## 2013-04-29 DIAGNOSIS — E1159 Type 2 diabetes mellitus with other circulatory complications: Secondary | ICD-10-CM

## 2013-04-29 DIAGNOSIS — M25519 Pain in unspecified shoulder: Secondary | ICD-10-CM

## 2013-04-29 DIAGNOSIS — E785 Hyperlipidemia, unspecified: Secondary | ICD-10-CM

## 2013-04-29 DIAGNOSIS — IMO0002 Reserved for concepts with insufficient information to code with codable children: Secondary | ICD-10-CM

## 2013-04-29 DIAGNOSIS — I10 Essential (primary) hypertension: Secondary | ICD-10-CM

## 2013-04-29 MED ORDER — LISINOPRIL-HYDROCHLOROTHIAZIDE 20-12.5 MG PO TABS: 2.0000 | ORAL_TABLET | Freq: Every day | ORAL | Status: DC

## 2013-04-29 MED ORDER — EZETIMIBE 10 MG PO TABS: 10.0000 mg | ORAL_TABLET | Freq: Every day | ORAL | Status: DC

## 2013-04-29 MED ORDER — SAXAGLIPTIN-METFORMIN ER 2.5-1000 MG PO TB24: 2.0000 | ORAL_TABLET | Freq: Every day | ORAL | Status: DC

## 2013-04-29 MED ORDER — GLIMEPIRIDE 2 MG PO TABS: 2.0000 mg | ORAL_TABLET | Freq: Every day | ORAL | Status: DC

## 2013-04-29 MED ORDER — HYDROCODONE-ACETAMINOPHEN 5-325 MG PO TABS: 1.0000 | ORAL_TABLET | Freq: Four times a day (QID) | ORAL | Status: DC | PRN

## 2013-04-29 NOTE — Assessment & Plan Note (Signed)
Check labs Cont' meds 

## 2013-04-29 NOTE — Progress Notes (Signed)
   Subjective:    Patient ID: John Beasley, male    DOB: Mar 07, 1951, 63 y.o.   MRN: [id number]  HPI  HPI HYPERTENSION  Blood pressure range-good  Chest pain- no      Dyspnea- no Lightheadedness- no   Edema- no Other side effects - no   Medication compliance: good Low salt diet- no  DIABETES  Blood Sugar ranges-106-130  Polyuria- no New Visual problems- no Hypoglycemic symptoms- yes-- 106 and lower pt gets nauseous Other side effects-no Medication compliance - good Last eye exam- due   HYPERLIPIDEMIA  Medication compliance- good RUQ pain- no  Muscle aches- no Other side effects-no   Pt also c/o R shoulder pain-- he tried to hit some tennis balls and re injured R shoulder.   ROS See HPI above   PMH Smoking Status noted      Review of Systems As above    Objective:   Physical Exam BP 138/80  Pulse 81  Temp(Src) 97.6 F (36.4 C) (Oral)  Wt 205 lb (92.987 kg)  SpO2 98% General appearance: alert, cooperative, appears stated age and no distress Throat: lips, mucosa, and tongue normal; teeth and gums normal Neck: no adenopathy, no carotid bruit, no JVD, supple, symmetrical, trachea midline and thyroid not enlarged, symmetric, no tenderness/mass/nodules Lungs: clear to auscultation bilaterally Heart: S1, S2 normal Extremities: extremities normal, atraumatic, no cyanosis or edema Neurologic: Motor: r shoulder--- pain and dec rom -- unable to abduct past 90 degrees       Assessment & Plan:

## 2013-04-29 NOTE — Progress Notes (Signed)
Pre visit review using our clinic review tool, if applicable. No additional management support is needed unless otherwise documented below in the visit note. 

## 2013-04-29 NOTE — Patient Instructions (Signed)

## 2013-04-29 NOTE — Assessment & Plan Note (Signed)
Check labs 

## 2013-04-29 NOTE — Assessment & Plan Note (Signed)
Stable con't meds 

## 2013-04-29 NOTE — Assessment & Plan Note (Signed)
?   Pinched nerve in neck-- pain gone with bending neck Refer ortho --HP meds per orders

## 2013-05-01 ENCOUNTER — Telehealth: Payer: Self-pay | Admitting: Family Medicine

## 2013-05-01 ENCOUNTER — Telehealth: Payer: Self-pay | Admitting: *Deleted

## 2013-05-01 DIAGNOSIS — M79603 Pain in arm, unspecified: Secondary | ICD-10-CM

## 2013-05-01 DIAGNOSIS — M542 Cervicalgia: Secondary | ICD-10-CM

## 2013-05-01 NOTE — Telephone Encounter (Signed)
Ok to put referral in 

## 2013-05-01 NOTE — Telephone Encounter (Signed)
Please see message from patient regarding request for referral: Can we order this?      still in a lot of pain at times even with meds, we had talked about the fact that arm pain might be coming from disc neck injury. Could I also get a referral to : [facility] [address], [place], [address] , phone: [phone] I saw them in 2010 and I remember they also said neck pain could be running down right arm as well and gave me meds & steroid pills.

## 2013-05-02 ENCOUNTER — Telehealth: Payer: Self-pay

## 2013-05-02 NOTE — Addendum Note (Signed)
Addended by: Ewing Schlein on: 05/02/2013 03:11 PM   Modules accepted: Orders

## 2013-05-02 NOTE — Telephone Encounter (Signed)
Relevant patient education assigned to patient using Emmi. ° °

## 2013-05-02 NOTE — Telephone Encounter (Signed)
Error. BC °

## 2013-05-08 ENCOUNTER — Telehealth: Payer: Self-pay | Admitting: Family Medicine

## 2013-05-08 ENCOUNTER — Other Ambulatory Visit: Payer: Self-pay | Admitting: Family Medicine

## 2013-05-08 DIAGNOSIS — M25519 Pain in unspecified shoulder: Secondary | ICD-10-CM

## 2013-05-08 MED ORDER — HYDROCODONE-ACETAMINOPHEN 5-325 MG PO TABS: 1.0000 | ORAL_TABLET | Freq: Four times a day (QID) | ORAL | Status: DC | PRN

## 2013-05-08 NOTE — Telephone Encounter (Signed)
Last seen and filled 04/29/13 #30. Please advise        KP

## 2013-05-08 NOTE — Telephone Encounter (Signed)
Ok to refill x 1  

## 2013-05-08 NOTE — Telephone Encounter (Signed)
Relevant patient education assigned to patient using Emmi. ° °

## 2013-07-05 ENCOUNTER — Ambulatory Visit: Payer: BC Managed Care – PPO | Admitting: Family Medicine

## 2013-07-11 ENCOUNTER — Ambulatory Visit: Payer: BC Managed Care – PPO | Admitting: Family Medicine

## 2013-07-19 LAB — HM DIABETES EYE EXAM

## 2013-09-19 ENCOUNTER — Other Ambulatory Visit: Payer: Self-pay | Admitting: Family Medicine

## 2013-09-26 ENCOUNTER — Other Ambulatory Visit: Payer: Self-pay | Admitting: Family Medicine

## 2013-09-26 DIAGNOSIS — R109 Unspecified abdominal pain: Secondary | ICD-10-CM

## 2013-09-30 ENCOUNTER — Ambulatory Visit
Admission: RE | Admit: 2013-09-30 | Discharge: 2013-09-30 | Disposition: A | Discharge status: Home / Self Care | Payer: BC Managed Care – PPO | Source: Ambulatory Visit | Attending: Family Medicine | Admitting: Family Medicine

## 2013-09-30 DIAGNOSIS — R109 Unspecified abdominal pain: Secondary | ICD-10-CM

## 2013-09-30 MED ORDER — IOHEXOL 300 MG/ML  SOLN
125.0000 mL | Freq: Once | INTRAMUSCULAR | Status: AC | PRN
  Administered 2013-09-30: 125 mL via INTRAVENOUS

## 2013-10-03 ENCOUNTER — Other Ambulatory Visit: Payer: Self-pay | Admitting: Family Medicine

## 2013-10-03 DIAGNOSIS — N2889 Other specified disorders of kidney and ureter: Secondary | ICD-10-CM

## 2013-10-03 DIAGNOSIS — R109 Unspecified abdominal pain: Secondary | ICD-10-CM

## 2013-10-18 ENCOUNTER — Telehealth: Payer: Self-pay

## 2013-10-18 NOTE — Telephone Encounter (Signed)
LVM for pt to call back and schedule CPE and PCP

## 2013-10-23 ENCOUNTER — Ambulatory Visit
Admission: RE | Admit: 2013-10-23 | Discharge: 2013-10-23 | Disposition: A | Discharge status: Home / Self Care | Payer: BC Managed Care – PPO | Source: Ambulatory Visit | Attending: Family Medicine | Admitting: Family Medicine

## 2013-10-23 DIAGNOSIS — N2889 Other specified disorders of kidney and ureter: Secondary | ICD-10-CM

## 2013-10-23 DIAGNOSIS — R109 Unspecified abdominal pain: Secondary | ICD-10-CM

## 2013-10-23 MED ORDER — GADOBENATE DIMEGLUMINE 529 MG/ML IV SOLN
20.0000 mL | Freq: Once | INTRAVENOUS | Status: AC | PRN
  Administered 2013-10-23: 20 mL via INTRAVENOUS

## 2013-11-11 ENCOUNTER — Other Ambulatory Visit (HOSPITAL_COMMUNITY): Payer: Self-pay | Admitting: Interventional Radiology

## 2013-11-11 DIAGNOSIS — R109 Unspecified abdominal pain: Secondary | ICD-10-CM

## 2013-11-11 DIAGNOSIS — N281 Cyst of kidney, acquired: Secondary | ICD-10-CM

## 2013-11-18 ENCOUNTER — Encounter (INDEPENDENT_AMBULATORY_CARE_PROVIDER_SITE_OTHER): Payer: Self-pay | Admitting: General Surgery

## 2013-11-18 ENCOUNTER — Ambulatory Visit (INDEPENDENT_AMBULATORY_CARE_PROVIDER_SITE_OTHER): Payer: BC Managed Care – PPO | Admitting: General Surgery

## 2013-11-18 VITALS — BP 118/70 | HR 73 | Temp 97.7°F | Ht 69.0 in | Wt 200.0 lb

## 2013-11-18 DIAGNOSIS — K8010 Calculus of gallbladder with chronic cholecystitis without obstruction: Secondary | ICD-10-CM | POA: Insufficient documentation

## 2013-11-18 NOTE — Assessment & Plan Note (Addendum)
The patient does have some right upper quadrant tenderness that would be consistent with chronic cholecystitis. I do not think  the patient will be successful making it until next summer to get his gallbladder out. We will tentatively set this up for Christmas vacation.  I will need to see him back within 30 days of surgery. If he is doing great, we may push surgery out to spring break.  He is a Education officer, environmental for high school students.    The surgical procedure was described to the patient in detail.  The patient was given Neurosurgeon. .  I discussed the incision type and location, the location of the gallbladder, the anatomy of the bile ducts and arteries, and the typical progression of surgery.  I discussed the possibility of converting to an open operation.  I advised of the risks of bleeding, infection, damage to other structures (such as the bile duct, intestine or liver), bile leak, need for other procedures or surgeries, and post op diarrhea/constipation.  We discussed the risk of blood clot.  We discussed the recovery period and post operative restrictions.  The patient was advised against taking blood thinners the week before surgery.

## 2013-11-18 NOTE — Patient Instructions (Signed)

## 2013-11-18 NOTE — Progress Notes (Signed)
Chief Complaint  Patient presents with  . eval gallbladder    Referring MD: Sheryn Bison, MD  HISTORY:  Pt is a 63 yo male referred for consultation by Dr. Sheryn Bison regarding his gallbladder.  He had imaging to evaluate some kidney cysts and gallstones were identified.  He has had fatty liver for a while and has a history of elevated LFTs which have waxed and waned.  When he loses weight and is more active, his LFTs decrease.    Past Medical History  Diagnosis Date  . Hypertension   . Hyperlipidemia   . GERD (gastroesophageal reflux disease)   . Diabetes mellitus   . Diastolic dysfunction   . Renal cyst   . Renal insufficiency     Past Surgical History  Procedure Laterality Date  . Kidney cyst removal  2007    [place]    Current Outpatient Prescriptions  Medication Sig Dispense Refill  . aluminum chloride (DRYSOL) 20 % external solution Apply topically at bedtime.        Marland Kitchen aspirin 81 MG tablet Take 81 mg by mouth daily.        Marland Kitchen ezetimibe (ZETIA) 10 MG tablet Take 1 tablet (10 mg total) by mouth daily.  30 tablet  5  . glimepiride (AMARYL) 2 MG tablet Take 1 tablet (2 mg total) by mouth daily before breakfast.  90 tablet  1  . lisinopril-hydrochlorothiazide (PRINZIDE,ZESTORETIC) 20-12.5 MG per tablet Take 2 tablets by mouth daily.  180 tablet  1  . Saxagliptin-Metformin (KOMBIGLYZE XR) 2.08-998 MG TB24 Take 2 tablets by mouth at bedtime.  60 tablet  5   No current facility-administered medications for this visit.     Allergies  Allergen Reactions  . Codeine   . Pravastatin Sodium   . Simvastatin     REACTION: muscle  sore     Family History  Problem Relation Age of Onset  . Diabetes    . Hyperlipidemia    . Hypertension    . Stroke    . Skin cancer    . Stroke Mother   . Heart disease Father      History   Social History  . Marital Status: Married    Spouse Name: N/A    Number of Children: N/A  . Years of Education: N/A   Social History Main Topics  .  Smoking status: Never Smoker   . Smokeless tobacco: Never Used  . Alcohol Use: Yes  . Drug Use: No  . Sexual Activity: None   Other Topics Concern  . None   Social History Narrative  . None     REVIEW OF SYSTEMS - PERTINENT POSITIVES ONLY: 12 point review of systems negative other than HPI and PMH  EXAM: Filed Vitals:   11/18/13 1328  BP: 118/70  Pulse: 73  Temp: 97.7 F (36.5 C)    Wt Readings from Last 3 Encounters:  11/18/13 200 lb (90.719 kg)  04/29/13 205 lb (92.987 kg)  02/05/13 205 lb (92.987 kg)     Gen:  No acute distress.  Well nourished and well groomed.   Neurological: Alert and oriented to person, place, and time. Coordination normal.  Head: Normocephalic and atraumatic.  Eyes: Conjunctivae are normal. Pupils are equal, round, and reactive to light. No scleral icterus.  Neck: Normal range of motion. Neck supple. No tracheal deviation or thyromegaly present.  Cardiovascular: Normal rate, regular rhythm, normal heart sounds and intact distal pulses.  Exam reveals no gallop and no friction  rub.  No murmur heard. Respiratory: Effort normal.  No respiratory distress. No chest wall tenderness. Breath sounds normal.  No wheezes, rales or rhonchi.  GI: Soft. Bowel sounds are normal. The abdomen is soft and non distended.  The RUQ is slightly tender.  There is no rebound and no guarding.  Musculoskeletal: Normal range of motion. Extremities are nontender.  Lymphadenopathy: No cervical, preauricular, postauricular or axillary adenopathy is present Skin: Skin is warm and dry. No rash noted. No diaphoresis. No erythema. No pallor. No clubbing, cyanosis, or edema.   Psychiatric: Normal mood and affect. Behavior is normal. Judgment and thought content normal.    LABORATORY RESULTS: Available labs are reviewed   No results found for this or any previous visit (from the past 2160 hour(s)).   RADIOLOGY RESULTS: See E-Chart or I-Site for most recent results.  Images  and reports are reviewed.  Mr Abdomen W Wo Contrast  10/23/2013   CLINICAL DATA:  History of renal cysts.  EXAM: MRI ABDOMEN WITHOUT AND WITH CONTRAST  TECHNIQUE: Multiplanar multisequence MR imaging of the abdomen was performed both before and after the administration of intravenous contrast.  CONTRAST:  92mL MULTIHANCE GADOBENATE DIMEGLUMINE 529 MG/ML IV SOLN  COMPARISON:  09/30/2013  FINDINGS: There is a mild fatty infiltration of the liver. No enhancing liver abnormalities identified. Stones are noted within the dependent portion of the gallbladder. No gallbladder wall thickening or pericholecystic fluid. Common bile duct is normal in caliber. Normal appearance of the pancreas. Visualized portions of the spleen are normal. The adrenal glands are both normal.  Multiple simple appearing cysts are identified within the right kidney. The largest is in the inferior pole measuring 4 cm, image 48/series 11. Multiple cysts are identified within the left kidney. Within the interpolar left kidney there is a multi lobulated cystic structure measure 4.1 x 6.2 x 6.6 cm. This contains multiple internal areas of enhancing septation. Along the inferior portion of this structure there are areas mural and septal calcification. No discrete nodule identified.  Normal caliber of the abdominal aorta. There is no aneurysm. No retroperitoneal or mesenteric adenopathy identified.  IMPRESSION: 1. Bosniak category 2 F cyst is identified within the left kidney. A followup examination in 12 months is recommended. The study of choice would be a contrast enhanced MRI of the kidneys. 2. Bosniak category 1 cysts are identified within the remaining portions of both kidneys. 3. Gallstones.   Electronically Signed   By: Kerby Moors M.D.   On: 10/23/2013 09:51      ASSESSMENT AND PLAN: Chronic cholecystitis with calculus The patient does have some right upper quadrant tenderness that would be consistent with chronic cholecystitis. I do  not think  the patient will be successful making it until next summer to get his gallbladder out. We will tentatively set this up for Christmas vacation.  I will need to see him back within 30 days of surgery. If he is doing great, we may push surgery out to spring break.  He is a Education officer, environmental for high school students.    The surgical procedure was described to the patient in detail.  The patient was given Neurosurgeon. .  I discussed the incision type and location, the location of the gallbladder, the anatomy of the bile ducts and arteries, and the typical progression of surgery.  I discussed the possibility of converting to an open operation.  I advised of the risks of bleeding, infection, damage to other structures (such as the  bile duct, intestine or liver), bile leak, need for other procedures or surgeries, and post op diarrhea/constipation.  We discussed the risk of blood clot.  We discussed the recovery period and post operative restrictions.  The patient was advised against taking blood thinners the week before surgery.          Milus Height MD Surgical Oncology, General and [facility]      Visit Diagnoses: 1. Chronic cholecystitis with calculus     Primary Care Physician: Cammy Copa, MD

## 2013-11-25 ENCOUNTER — Encounter: Payer: Self-pay | Admitting: General Practice

## 2013-11-26 ENCOUNTER — Ambulatory Visit
Admission: RE | Admit: 2013-11-26 | Discharge: 2013-11-26 | Disposition: A | Discharge status: Home / Self Care | Payer: BC Managed Care – PPO | Source: Ambulatory Visit | Attending: Interventional Radiology | Admitting: Interventional Radiology

## 2013-11-26 DIAGNOSIS — R109 Unspecified abdominal pain: Secondary | ICD-10-CM

## 2013-11-26 DIAGNOSIS — N281 Cyst of kidney, acquired: Secondary | ICD-10-CM

## 2013-11-26 NOTE — Consult Note (Signed)
  ESTABLISHED PATIENT OFFICE VISIT   CHIEF COMPLAINT: [Left flank pain] Current?Pain?Level:?[2]  HISTORY OF PRESENT ILLNESS: [Patient known to our service from previous large left lower pole renal cyst aspiration 09/18/2003 for left flank pain.  Cytology was benign.  Pain recurred as did the cyst, so percutaneous aspiration and ethanol sclerosis was performed 11/12/2009, again with good relief of symptoms, no complications.  He is having some recurrence of the same sort of pain.  Pain is sporadic but bothersome and he is motivated to see treatment.    Recent MRI obtained 10/23/2013 shows continued slow enlargement of most of the small bilateral simple cysts seen on scans dating back to 01/01/2006.  The largest cyst on the left measures up to 6.6 cm and has some enhancing septations and calcification.  Part of this complex at its inferior margin represents the larger cyst which has undergone ethanol sclerosis and has not significantly returned to its previous volume.]  PHYSICAL EXAMINATION: [Normal mood and affect.  Ambulates independently.  Above ideal body weight.  Afebrile, blood pressure 143/67, respirations 16, pulse 72, O2 sats under percent on room air.]  ASSESSMENT AND PLAN: Berniece Pap has done well since previous renal cyst aspiration and ablation.  He has recurrence of the same left upper abdominal/flank symptoms but imaging shows no dramatic increase in size of any of the cysts.  He is motivated to see treatment.  I think would be safe in reasonable to approach the largest septated cyst in the mid/upper left kidney under ultrasound guidance for aspiration.  If symptoms resolve as  in the past, and the cyst reaccumulates and symptoms recur, at that point I would again consider ethanol sclerosis.  Again I would send the cysts fluid for cytology analysis given the complicated appearance on MR in order to exclude neoplasia.  He was motivated to proceed, and we can set  up left renal cyst  aspiration at his convenience at the hospital under ultrasound guidance on outpatient basis.]

## 2013-12-02 ENCOUNTER — Telehealth: Payer: Self-pay

## 2013-12-02 NOTE — Telephone Encounter (Signed)
Sending letter regarding DM bundle. Sent to address that we have on file.

## 2013-12-10 DIAGNOSIS — M5412 Radiculopathy, cervical region: Secondary | ICD-10-CM | POA: Insufficient documentation

## 2014-09-17 ENCOUNTER — Other Ambulatory Visit: Payer: Self-pay | Admitting: General Surgery

## 2014-09-18 ENCOUNTER — Encounter (HOSPITAL_COMMUNITY)
Admission: RE | Admit: 2014-09-18 | Discharge: 2014-09-18 | Disposition: A | Discharge status: Home / Self Care | Payer: BC Managed Care – PPO | Source: Ambulatory Visit | Attending: General Surgery | Admitting: General Surgery

## 2014-09-18 ENCOUNTER — Encounter (HOSPITAL_COMMUNITY): Payer: Self-pay

## 2014-09-18 DIAGNOSIS — Z01818 Encounter for other preprocedural examination: Secondary | ICD-10-CM | POA: Diagnosis present

## 2014-09-18 DIAGNOSIS — R9431 Abnormal electrocardiogram [ECG] [EKG]: Secondary | ICD-10-CM | POA: Diagnosis not present

## 2014-09-18 DIAGNOSIS — Z79899 Other long term (current) drug therapy: Secondary | ICD-10-CM | POA: Insufficient documentation

## 2014-09-18 DIAGNOSIS — Z01812 Encounter for preprocedural laboratory examination: Secondary | ICD-10-CM | POA: Insufficient documentation

## 2014-09-18 DIAGNOSIS — E119 Type 2 diabetes mellitus without complications: Secondary | ICD-10-CM | POA: Insufficient documentation

## 2014-09-18 DIAGNOSIS — K8020 Calculus of gallbladder without cholecystitis without obstruction: Secondary | ICD-10-CM | POA: Diagnosis not present

## 2014-09-18 DIAGNOSIS — N289 Disorder of kidney and ureter, unspecified: Secondary | ICD-10-CM | POA: Insufficient documentation

## 2014-09-18 DIAGNOSIS — I10 Essential (primary) hypertension: Secondary | ICD-10-CM | POA: Diagnosis not present

## 2014-09-18 DIAGNOSIS — E785 Hyperlipidemia, unspecified: Secondary | ICD-10-CM | POA: Diagnosis not present

## 2014-09-18 DIAGNOSIS — K219 Gastro-esophageal reflux disease without esophagitis: Secondary | ICD-10-CM | POA: Diagnosis not present

## 2014-09-18 LAB — CBC WITH DIFFERENTIAL/PLATELET
Basophils Absolute: 0.1 10*3/uL (ref 0.0–0.1)
Basophils Relative: 1 % (ref 0–1)
Eosinophils Absolute: 0.1 10*3/uL (ref 0.0–0.7)
Eosinophils Relative: 1 % (ref 0–5)
HEMATOCRIT: 41.1 % (ref 39.0–52.0)
HEMOGLOBIN: 14.5 g/dL (ref 13.0–17.0)
LYMPHS ABS: 2 10*3/uL (ref 0.7–4.0)
Lymphocytes Relative: 32 % (ref 12–46)
MCH: 33 pg (ref 26.0–34.0)
MCHC: 35.3 g/dL (ref 30.0–36.0)
MCV: 93.4 fL (ref 78.0–100.0)
MONO ABS: 0.3 10*3/uL (ref 0.1–1.0)
MONOS PCT: 5 % (ref 3–12)
NEUTROS ABS: 4 10*3/uL (ref 1.7–7.7)
NEUTROS PCT: 61 % (ref 43–77)
Platelets: 180 10*3/uL (ref 150–400)
RBC: 4.4 MIL/uL (ref 4.22–5.81)
RDW: 12.5 % (ref 11.5–15.5)
WBC: 6.4 10*3/uL (ref 4.0–10.5)

## 2014-09-18 LAB — COMPREHENSIVE METABOLIC PANEL
ALBUMIN: 3.8 g/dL (ref 3.5–5.0)
ALT: 80 U/L — ABNORMAL HIGH (ref 17–63)
ANION GAP: 10 (ref 5–15)
AST: 70 U/L — ABNORMAL HIGH (ref 15–41)
Alkaline Phosphatase: 44 U/L (ref 38–126)
BUN: 17 mg/dL (ref 6–20)
CO2: 23 mmol/L (ref 22–32)
CREATININE: 1.19 mg/dL (ref 0.61–1.24)
Calcium: 9.1 mg/dL (ref 8.9–10.3)
Chloride: 104 mmol/L (ref 101–111)
GFR calc Af Amer: >60 mL/min (ref >60)
GFR calc non Af Amer: >60 mL/min (ref >60)
Glucose, Bld: 301 mg/dL — ABNORMAL HIGH (ref 65–99)
POTASSIUM: 4.5 mmol/L (ref 3.5–5.1)
Sodium: 137 mmol/L (ref 135–145)
TOTAL PROTEIN: 6.8 g/dL (ref 6.5–8.1)
Total Bilirubin: 0.5 mg/dL (ref 0.3–1.2)

## 2014-09-18 LAB — PROTIME-INR
INR: 1.07 (ref 0.00–1.49)
Prothrombin Time: 14.1 seconds (ref 11.6–15.2)

## 2014-09-18 LAB — GLUCOSE, CAPILLARY: Glucose-Capillary: 245 mg/dL — ABNORMAL HIGH (ref 65–99)

## 2014-09-18 NOTE — Progress Notes (Signed)
Quick Note:  Please let patient know his labs looked pretty good except for his blood sugar. It was 300. He may need to increase his diabetic meds. Please see if anything was different this time when his blood was drawn. ______

## 2014-09-18 NOTE — Pre-Procedure Instructions (Signed)
    Forrestine Him Jr.  09/18/2014      [facility] [place], [place]. [place]. [place] [address] Phone: [phone] Fax: [phone]    Your procedure is scheduled on 09-30-2014  Tuesday   Report to [facility] Admitting at 8:30 A.M.   Call this number if you have problems the morning of surgery:  [phone]   Remember:  Do not eat food or drink liquids after midnight.   Take these medicines the morning of surgery with A SIP OF WATER none              No diabetic medications the morning of surgery   Do not wear jewelry,  Do not wear lotions, powders,  Do not shave 48 hours prior to surgery.  Men may shave face and neck.   Do not bring valuables to the hospital.  [facility] is not responsible for any belongings or valuables.  Contacts, dentures or bridgework may not be worn into surgery.  Leave your suitcase in the car.  After surgery it may be brought to your room.  For patients admitted to the hospital, discharge time will be determined by your treatment team.  Patients discharged the day of surgery will not be allowed to drive home.   Special instructions:  See attached sheet for instructions on CHG shower/bath  Please read over the following fact sheets that you were given. Pain Booklet, Coughing and Deep Breathing and Surgical Site Infection Prevention

## 2014-09-18 NOTE — Progress Notes (Addendum)
Anesthesia Chart Review:  Pt is 64 year old male scheduled for laparoscopic cholecystectomy with intraoperative cholangiogram on 09/30/2014 with Dr. Barry Dienes.   PCP is Dr. Aura Dials.   PMH includes: HTN, DM, hyperlipidemia, GERD, diastolic dysfunction, renal insufficiency. Never smoker. BMI 30.  Medications include: zetia, glimepiride, lisinopril-hctz, metformin.   Preoperative labs reviewed.  Glucose 301. HgbA1c 9.1. Pt does not check blood sugars at home.   EKG 09/18/2014: NSR. Cannot rule out Anterior infarct, age undetermined  Echo 09/24/2009: - Normal LV size and systolic function, EF 25-05%. Moderate diastolic dysfunction (grade 2). No significant valvular dysfunction. Normal RV size and systolic function.  Awaiting HgbA1c from PCP's office.   Willeen Cass, FNP-BC [facility]/Anesthesiology Phone: [phone] 09/18/2014 3:22 PM  Addendum:  Contacted PCP's office. Most recent HgbA1c was done in March and was 7.5.   Willeen Cass, FNP-BC [facility] Short Stay [facility] Center/Anesthesiology Phone: [phone] 09/22/2014 2:31 PM

## 2014-09-19 LAB — HEMOGLOBIN A1C
HEMOGLOBIN A1C: 9.1 % — AB (ref 4.8–5.6)
MEAN PLASMA GLUCOSE: 214 mg/dL

## 2014-09-29 MED ORDER — CEFAZOLIN SODIUM-DEXTROSE 2-3 GM-% IV SOLR
2.0000 g | INTRAVENOUS | Status: AC
  Administered 2014-09-30: 2 g via INTRAVENOUS
  Filled 2014-09-29: qty 50

## 2014-09-30 ENCOUNTER — Encounter (HOSPITAL_COMMUNITY)
Admission: RE | Disposition: A | Discharge status: Home / Self Care | Payer: Self-pay | Source: Ambulatory Visit | Attending: General Surgery

## 2014-09-30 ENCOUNTER — Encounter (HOSPITAL_COMMUNITY): Payer: Self-pay | Admitting: Anesthesiology

## 2014-09-30 ENCOUNTER — Ambulatory Visit (HOSPITAL_COMMUNITY)
Admission: RE | Admit: 2014-09-30 | Discharge: 2014-09-30 | Disposition: A | Discharge status: Home / Self Care | Payer: BC Managed Care – PPO | Source: Ambulatory Visit | Attending: General Surgery | Admitting: General Surgery

## 2014-09-30 ENCOUNTER — Ambulatory Visit (HOSPITAL_COMMUNITY): Payer: BC Managed Care – PPO | Admitting: Emergency Medicine

## 2014-09-30 ENCOUNTER — Ambulatory Visit (HOSPITAL_COMMUNITY): Payer: BC Managed Care – PPO | Admitting: Anesthesiology

## 2014-09-30 ENCOUNTER — Ambulatory Visit (HOSPITAL_COMMUNITY): Payer: BC Managed Care – PPO

## 2014-09-30 DIAGNOSIS — I509 Heart failure, unspecified: Secondary | ICD-10-CM | POA: Insufficient documentation

## 2014-09-30 DIAGNOSIS — I252 Old myocardial infarction: Secondary | ICD-10-CM | POA: Diagnosis not present

## 2014-09-30 DIAGNOSIS — E119 Type 2 diabetes mellitus without complications: Secondary | ICD-10-CM | POA: Diagnosis not present

## 2014-09-30 DIAGNOSIS — K8010 Calculus of gallbladder with chronic cholecystitis without obstruction: Secondary | ICD-10-CM | POA: Diagnosis present

## 2014-09-30 DIAGNOSIS — K219 Gastro-esophageal reflux disease without esophagitis: Secondary | ICD-10-CM | POA: Diagnosis not present

## 2014-09-30 DIAGNOSIS — I10 Essential (primary) hypertension: Secondary | ICD-10-CM | POA: Insufficient documentation

## 2014-09-30 DIAGNOSIS — Z79899 Other long term (current) drug therapy: Secondary | ICD-10-CM | POA: Insufficient documentation

## 2014-09-30 DIAGNOSIS — N289 Disorder of kidney and ureter, unspecified: Secondary | ICD-10-CM | POA: Diagnosis not present

## 2014-09-30 DIAGNOSIS — K828 Other specified diseases of gallbladder: Secondary | ICD-10-CM | POA: Insufficient documentation

## 2014-09-30 DIAGNOSIS — Z85820 Personal history of malignant melanoma of skin: Secondary | ICD-10-CM | POA: Insufficient documentation

## 2014-09-30 DIAGNOSIS — K7689 Other specified diseases of liver: Secondary | ICD-10-CM | POA: Insufficient documentation

## 2014-09-30 DIAGNOSIS — E780 Pure hypercholesterolemia: Secondary | ICD-10-CM | POA: Diagnosis not present

## 2014-09-30 DIAGNOSIS — K8020 Calculus of gallbladder without cholecystitis without obstruction: Secondary | ICD-10-CM

## 2014-09-30 HISTORY — PX: CHOLECYSTECTOMY: SHX55

## 2014-09-30 LAB — GLUCOSE, CAPILLARY
Glucose-Capillary: 187 mg/dL — ABNORMAL HIGH (ref 65–99)
Glucose-Capillary: 171 mg/dL — ABNORMAL HIGH (ref 65–99)

## 2014-09-30 SURGERY — LAPAROSCOPIC CHOLECYSTECTOMY WITH INTRAOPERATIVE CHOLANGIOGRAM
Anesthesia: General | Site: Abdomen

## 2014-09-30 MED ORDER — BUPIVACAINE-EPINEPHRINE (PF) 0.25% -1:200000 IJ SOLN
INTRAMUSCULAR | Status: AC
  Filled 2014-09-30: qty 30

## 2014-09-30 MED ORDER — LIDOCAINE HCL (CARDIAC) 20 MG/ML IV SOLN
INTRAVENOUS | Status: DC | PRN
  Administered 2014-09-30: 70 mg via INTRAVENOUS

## 2014-09-30 MED ORDER — PROPOFOL 10 MG/ML IV BOLUS
INTRAVENOUS | Status: DC | PRN
  Administered 2014-09-30: 160 mg via INTRAVENOUS

## 2014-09-30 MED ORDER — OXYCODONE-ACETAMINOPHEN 5-325 MG PO TABS: 1.0000 | ORAL_TABLET | ORAL | Status: DC | PRN

## 2014-09-30 MED ORDER — LACTATED RINGERS IV SOLN
INTRAVENOUS | Status: DC | PRN
  Administered 2014-09-30 (×2): via INTRAVENOUS

## 2014-09-30 MED ORDER — ONDANSETRON HCL 4 MG/2ML IJ SOLN
INTRAMUSCULAR | Status: DC | PRN
  Administered 2014-09-30: 4 mg via INTRAVENOUS

## 2014-09-30 MED ORDER — GLYCOPYRROLATE 0.2 MG/ML IJ SOLN
INTRAMUSCULAR | Status: AC
  Filled 2014-09-30: qty 2

## 2014-09-30 MED ORDER — NEOSTIGMINE METHYLSULFATE 10 MG/10ML IV SOLN
INTRAVENOUS | Status: DC | PRN
  Administered 2014-09-30: 3 mg via INTRAVENOUS

## 2014-09-30 MED ORDER — ONDANSETRON HCL 4 MG/2ML IJ SOLN
INTRAMUSCULAR | Status: AC
  Filled 2014-09-30: qty 2

## 2014-09-30 MED ORDER — MIDAZOLAM HCL 2 MG/2ML IJ SOLN
INTRAMUSCULAR | Status: AC
  Filled 2014-09-30: qty 2

## 2014-09-30 MED ORDER — LIDOCAINE HCL (PF) 1 % IJ SOLN
INTRAMUSCULAR | Status: AC
  Filled 2014-09-30: qty 30

## 2014-09-30 MED ORDER — OXYCODONE HCL 5 MG PO TABS
5.0000 mg | ORAL_TABLET | Freq: Once | ORAL | Status: AC | PRN
  Administered 2014-09-30: 5 mg via ORAL

## 2014-09-30 MED ORDER — FENTANYL CITRATE (PF) 100 MCG/2ML IJ SOLN
25.0000 ug | INTRAMUSCULAR | Status: DC | PRN
  Administered 2014-09-30 (×3): 50 ug via INTRAVENOUS

## 2014-09-30 MED ORDER — 0.9 % SODIUM CHLORIDE (POUR BTL) OPTIME
TOPICAL | Status: DC | PRN
  Administered 2014-09-30 (×2): 1000 mL

## 2014-09-30 MED ORDER — FENTANYL CITRATE (PF) 250 MCG/5ML IJ SOLN
INTRAMUSCULAR | Status: AC
  Filled 2014-09-30: qty 5

## 2014-09-30 MED ORDER — MIDAZOLAM HCL 5 MG/5ML IJ SOLN
INTRAMUSCULAR | Status: DC | PRN
  Administered 2014-09-30: 2 mg via INTRAVENOUS

## 2014-09-30 MED ORDER — PHENYLEPHRINE HCL 10 MG/ML IJ SOLN
INTRAMUSCULAR | Status: DC | PRN
  Administered 2014-09-30 (×3): 80 ug via INTRAVENOUS

## 2014-09-30 MED ORDER — EPHEDRINE SULFATE 50 MG/ML IJ SOLN
INTRAMUSCULAR | Status: DC | PRN
  Administered 2014-09-30: 10 mg via INTRAVENOUS

## 2014-09-30 MED ORDER — OXYCODONE-ACETAMINOPHEN 5-325 MG PO TABS: 1.0000 | ORAL_TABLET | Freq: Four times a day (QID) | ORAL | Status: DC | PRN

## 2014-09-30 MED ORDER — PROPOFOL 10 MG/ML IV BOLUS
INTRAVENOUS | Status: AC
  Filled 2014-09-30: qty 20

## 2014-09-30 MED ORDER — FENTANYL CITRATE (PF) 100 MCG/2ML IJ SOLN
INTRAMUSCULAR | Status: AC
  Administered 2014-09-30: 50 ug via INTRAVENOUS
  Filled 2014-09-30: qty 2

## 2014-09-30 MED ORDER — FENTANYL CITRATE (PF) 100 MCG/2ML IJ SOLN
INTRAMUSCULAR | Status: DC
Start: 2014-09-30 — End: 2014-09-30
  Filled 2014-09-30: qty 2

## 2014-09-30 MED ORDER — ROCURONIUM BROMIDE 50 MG/5ML IV SOLN
INTRAVENOUS | Status: AC
  Filled 2014-09-30: qty 1

## 2014-09-30 MED ORDER — FENTANYL CITRATE (PF) 100 MCG/2ML IJ SOLN
INTRAMUSCULAR | Status: DC | PRN
  Administered 2014-09-30: 150 ug via INTRAVENOUS
  Administered 2014-09-30 (×2): 100 ug via INTRAVENOUS

## 2014-09-30 MED ORDER — LIDOCAINE HCL (CARDIAC) 20 MG/ML IV SOLN
INTRAVENOUS | Status: AC
  Filled 2014-09-30: qty 5

## 2014-09-30 MED ORDER — LIDOCAINE HCL 1 % IJ SOLN
INTRAMUSCULAR | Status: DC | PRN
  Administered 2014-09-30: 18 mL

## 2014-09-30 MED ORDER — SODIUM CHLORIDE 0.9 % IV SOLN
INTRAVENOUS | Status: DC | PRN
  Administered 2014-09-30: 5 mL

## 2014-09-30 MED ORDER — GLYCOPYRROLATE 0.2 MG/ML IJ SOLN
INTRAMUSCULAR | Status: DC | PRN
  Administered 2014-09-30: 0.4 mg via INTRAVENOUS

## 2014-09-30 MED ORDER — OXYCODONE HCL 5 MG PO TABS
ORAL_TABLET | ORAL | Status: AC
  Filled 2014-09-30: qty 1

## 2014-09-30 MED ORDER — SODIUM CHLORIDE 0.9 % IR SOLN
Status: DC | PRN
  Administered 2014-09-30: 1000 mL

## 2014-09-30 MED ORDER — SUCCINYLCHOLINE CHLORIDE 20 MG/ML IJ SOLN
INTRAMUSCULAR | Status: AC
  Filled 2014-09-30: qty 1

## 2014-09-30 MED ORDER — OXYCODONE HCL 5 MG/5ML PO SOLN: 5.0000 mg | Freq: Once | ORAL | Status: AC | PRN

## 2014-09-30 MED ORDER — LACTATED RINGERS IV SOLN
INTRAVENOUS | Status: DC
  Administered 2014-09-30: 09:00:00 via INTRAVENOUS

## 2014-09-30 MED ORDER — ROCURONIUM BROMIDE 100 MG/10ML IV SOLN
INTRAVENOUS | Status: DC | PRN
  Administered 2014-09-30: 40 mg via INTRAVENOUS

## 2014-09-30 MED ORDER — NEOSTIGMINE METHYLSULFATE 10 MG/10ML IV SOLN
INTRAVENOUS | Status: AC
  Filled 2014-09-30: qty 1

## 2014-09-30 SURGICAL SUPPLY — 49 items
APPLIER CLIP ROT 10 11.4 M/L (STAPLE) ×3
BLADE SURG ROTATE 9660 (MISCELLANEOUS) ×3 IMPLANT
CANISTER SUCTION 2500CC (MISCELLANEOUS) ×3 IMPLANT
CHLORAPREP W/TINT 26ML (MISCELLANEOUS) ×3 IMPLANT
CLIP APPLIE ROT 10 11.4 M/L (STAPLE) ×1 IMPLANT
COVER MAYO STAND STRL (DRAPES) ×3 IMPLANT
COVER SURGICAL LIGHT HANDLE (MISCELLANEOUS) ×3 IMPLANT
DECANTER SPIKE VIAL GLASS SM (MISCELLANEOUS) ×3 IMPLANT
DRAPE C-ARM 42X72 X-RAY (DRAPES) ×3 IMPLANT
DRAPE WARM FLUID 44X44 (DRAPE) ×3 IMPLANT
ELECT REM PT RETURN 9FT ADLT (ELECTROSURGICAL) ×3
ELECTRODE REM PT RTRN 9FT ADLT (ELECTROSURGICAL) ×1 IMPLANT
FILTER SMOKE EVAC LAPAROSHD (FILTER) IMPLANT
GLOVE BIO SURGEON STRL SZ 6 (GLOVE) ×6 IMPLANT
GLOVE BIO SURGEON STRL SZ7.5 (GLOVE) ×3 IMPLANT
GLOVE BIOGEL PI IND STRL 6.5 (GLOVE) ×1 IMPLANT
GLOVE BIOGEL PI IND STRL 7.0 (GLOVE) ×2 IMPLANT
GLOVE BIOGEL PI IND STRL 8 (GLOVE) ×1 IMPLANT
GLOVE BIOGEL PI INDICATOR 6.5 (GLOVE) ×2
GLOVE BIOGEL PI INDICATOR 7.0 (GLOVE) ×4
GLOVE BIOGEL PI INDICATOR 8 (GLOVE) ×2
GLOVE SURG ORTHO 8.0 STRL STRW (GLOVE) ×3 IMPLANT
GLOVE SURG SS PI 7.0 STRL IVOR (GLOVE) ×6 IMPLANT
GLOVE SURG SS PI 8.0 STRL IVOR (GLOVE) ×3 IMPLANT
GOWN STRL REUS W/ TWL LRG LVL3 (GOWN DISPOSABLE) ×3 IMPLANT
GOWN STRL REUS W/ TWL XL LVL3 (GOWN DISPOSABLE) ×1 IMPLANT
GOWN STRL REUS W/TWL 2XL LVL3 (GOWN DISPOSABLE) ×3 IMPLANT
GOWN STRL REUS W/TWL LRG LVL3 (GOWN DISPOSABLE) ×6
GOWN STRL REUS W/TWL XL LVL3 (GOWN DISPOSABLE) ×2
KIT BASIN OR (CUSTOM PROCEDURE TRAY) ×3 IMPLANT
KIT ROOM TURNOVER OR (KITS) ×3 IMPLANT
LIQUID BAND (GAUZE/BANDAGES/DRESSINGS) ×3 IMPLANT
NS IRRIG 1000ML POUR BTL (IV SOLUTION) ×3 IMPLANT
PAD ARMBOARD 7.5X6 YLW CONV (MISCELLANEOUS) ×3 IMPLANT
PENCIL BUTTON HOLSTER BLD 10FT (ELECTRODE) ×3 IMPLANT
POUCH SPECIMEN RETRIEVAL 10MM (ENDOMECHANICALS) ×3 IMPLANT
SCISSORS LAP 5X35 DISP (ENDOMECHANICALS) ×3 IMPLANT
SET CHOLANGIOGRAPH 5 50 .035 (SET/KITS/TRAYS/PACK) ×3 IMPLANT
SET IRRIG TUBING LAPAROSCOPIC (IRRIGATION / IRRIGATOR) ×3 IMPLANT
SLEEVE ENDOPATH XCEL 5M (ENDOMECHANICALS) ×3 IMPLANT
SPECIMEN JAR SMALL (MISCELLANEOUS) ×3 IMPLANT
SUT MNCRL AB 4-0 PS2 18 (SUTURE) ×3 IMPLANT
TOWEL OR 17X24 6PK STRL BLUE (TOWEL DISPOSABLE) ×3 IMPLANT
TOWEL OR 17X26 10 PK STRL BLUE (TOWEL DISPOSABLE) ×3 IMPLANT
TRAY LAPAROSCOPIC (CUSTOM PROCEDURE TRAY) ×3 IMPLANT
TROCAR XCEL BLUNT TIP 100MML (ENDOMECHANICALS) ×3 IMPLANT
TROCAR XCEL NON-BLD 11X100MML (ENDOMECHANICALS) ×3 IMPLANT
TROCAR XCEL NON-BLD 5MMX100MML (ENDOMECHANICALS) ×3 IMPLANT
TUBING INSUFFLATION (TUBING) ×3 IMPLANT

## 2014-09-30 NOTE — Anesthesia Procedure Notes (Signed)
Procedure Name: Intubation Date/Time: 09/30/2014 11:17 AM Performed by: Rebekah Chesterfield L Pre-anesthesia Checklist: Patient identified, Emergency Drugs available, Suction available, Patient being monitored and Timeout performed Patient Re-evaluated:Patient Re-evaluated prior to inductionOxygen Delivery Method: Circle system utilized Preoxygenation: Pre-oxygenation with 100% oxygen Intubation Type: IV induction Ventilation: Mask ventilation without difficulty Laryngoscope Size: Mac and 4 Grade View: Grade I Tube type: Oral Tube size: 8.0 mm Number of attempts: 1 Airway Equipment and Method: Stylet Placement Confirmation: ETT inserted through vocal cords under direct vision,  breath sounds checked- equal and bilateral and positive ETCO2 Secured at: 22 cm Tube secured with: Tape Dental Injury: Teeth and Oropharynx as per pre-operative assessment

## 2014-09-30 NOTE — Op Note (Signed)
Laparoscopic Cholecystectomy with IOC Procedure Note  Indications: This patient presents with chronic cholecystitis and will undergo laparoscopic cholecystectomy.  Pre-operative Diagnosis: chronic cholecystitis with stones  Post-operative Diagnosis: Same  Surgeon: Stark Klein   Assistants: Harlow Asa, TODD  Anesthesia: General endotracheal anesthesia and local  ASA Class: 2  Procedure Details  The patient was seen again in the Holding Room. The risks, benefits, complications, treatment options, and expected outcomes were discussed with the patient. The possibilities of  bleeding, recurrent infection, damage to nearby structures, the need for additional procedures, failure to diagnose a condition, the possible need to convert to an open procedure, and creating a complication requiring transfusion or operation were discussed with the patient. The likelihood of improving the patient's symptoms with return to their baseline status is good.    The patient and/or family concurred with the proposed plan, giving informed consent. The site of surgery properly noted. The patient was taken to Operating Room, and the procedure verified as Laparoscopic Cholecystectomy with possible Intraoperative Cholangiogram. A Time Out was held and the above information confirmed.  Prior to the induction of general anesthesia, antibiotic prophylaxis was administered. General endotracheal anesthesia was then administered and tolerated well. After the induction, the abdomen was prepped with Chloraprep and draped in the sterile fashion. The patient was positioned in the supine position.  Local anesthetic agent was injected into the skin near the umbilicus and an incision made. We dissected down to the abdominal fascia with blunt dissection.  The fascia was incised vertically and we entered the peritoneal cavity bluntly.  A pursestring suture of 0-Vicryl was placed around the fascial opening.  The Hasson cannula was inserted  and secured with the stay suture.  Pneumoperitoneum was then created with CO2 and tolerated well without any adverse changes in the patient's vital signs. An 11-mm port was placed in the subxiphoid position.  Two 5-mm ports were placed in the right upper quadrant. All skin incisions were infiltrated with a local anesthetic agent before making the incision and placing the trocars.   We positioned the patient in reverse Trendelenburg, tilted slightly to the patient's left.  The gallbladder was identified, the fundus grasped and retracted cephalad. Adhesions were lysed bluntly and with the electrocautery where indicated, taking care not to injure any adjacent organs or viscus. The infundibulum was grasped and retracted laterally, exposing the peritoneum overlying the triangle of Calot. This was then divided and exposed in a blunt fashion. A critical view of the cystic duct and cystic artery was obtained.  The cystic duct was clearly identified and bluntly dissected circumferentially. The cystic duct was ligated with a clip distally.   An incision was made in the cystic duct and the [facility] cholangiogram catheter introduced. The catheter was secured using a clip. A cholangiogram was then performed, demonstrating long cystic duct.  The cystic duct was then ligated with clips and divided. The cystic artery was identified, dissected free, ligated with clips and divided as well.   The gallbladder was dissected from the liver bed in retrograde fashion with the electrocautery. The gallbladder was removed and placed in an Endocatch bag.  The gallbladder and Endocatch bag were then removed through the umbilical port site.  The liver bed was irrigated and inspected. Hemostasis was achieved with the electrocautery. Copious irrigation was utilized and was repeatedly aspirated until clear.    We again inspected the right upper quadrant for hemostasis.  Pneumoperitoneum was released as we removed the trocars.   The pursestring  suture was  used to close the umbilical fascia.  4-0 Monocryl was used to close the skin.   The skin was cleaned and dry, and Dermabond was applied. The patient was then extubated and brought to the recovery room in stable condition. Instrument, sponge, and needle counts were correct at closure and at the conclusion of the case.   Findings: Mild chronic inflammation at the infundibulum.  Nodular contour of the liver.  No LIH.  No evidence of colitis, diverticulitis, or ischemia.  No evidence for etiology of LLQ pain.      Estimated Blood Loss: min         Drains: none          Specimens: Gallbladder to pathology       Complications: None; patient tolerated the procedure well.         Disposition: PACU - hemodynamically stable.         Condition: stable

## 2014-09-30 NOTE — Interval H&P Note (Signed)
History and Physical Interval Note:  09/30/2014 11:05 AM  John Beasley  has presented today for surgery, with the diagnosis of choronic cholelithiasis with calculus  The various methods of treatment have been discussed with the patient and family. After consideration of risks, benefits and other options for treatment, the patient has consented to  Procedure(s): LAPAROSCOPIC CHOLECYSTECTOMY WITH INTRAOPERATIVE CHOLANGIOGRAM (N/A) as a surgical intervention .  The patient's history has been reviewed, patient examined, no change in status, stable for surgery.  I have reviewed the patient's chart and labs.  Questions were answered to the patient's satisfaction.     Huxley Shurley

## 2014-09-30 NOTE — Anesthesia Preprocedure Evaluation (Signed)
Anesthesia Evaluation  Patient identified by MRN, date of birth, ID band Patient awake    Reviewed: Allergy & Precautions, NPO status , Patient's Chart, lab work & pertinent test results  History of Anesthesia Complications Negative for: history of anesthetic complications  Airway Mallampati: III  TM Distance: >3 FB Neck ROM: Full    Dental  (+) Teeth Intact   Pulmonary neg pulmonary ROS,  breath sounds clear to auscultation        Cardiovascular hypertension, Pt. on medications - angina- Past MI and - CHF - dysrhythmias Rhythm:Regular     Neuro/Psych negative neurological ROS  negative psych ROS   GI/Hepatic Neg liver ROS, GERD-  Controlled,  Endo/Other  diabetes, Type 2, Oral Hypoglycemic Agents  Renal/GU Renal InsufficiencyRenal disease     Musculoskeletal negative musculoskeletal ROS (+)   Abdominal   Peds  Hematology negative hematology ROS (+)   Anesthesia Other Findings   Reproductive/Obstetrics                             Anesthesia Physical Anesthesia Plan  ASA: II  Anesthesia Plan: General   Post-op Pain Management:    Induction: Intravenous  Airway Management Planned: Oral ETT  Additional Equipment: None  Intra-op Plan:   Post-operative Plan: Extubation in OR  Informed Consent: I have reviewed the patients History and Physical, chart, labs and discussed the procedure including the risks, benefits and alternatives for the proposed anesthesia with the patient or authorized representative who has indicated his/her understanding and acceptance.   Dental advisory given  Plan Discussed with: CRNA and Surgeon  Anesthesia Plan Comments:         Anesthesia Quick Evaluation

## 2014-09-30 NOTE — H&P (Signed)
John Beasley  Location: [facility] Patient #: [id number] DOB: Dec 05, 1950 Married / Language: English / Race: White Male  History of Present Illness Patient words: GB.  The patient is a 64 year old male who presents for evaluation of gall stones. 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 He is having a bit more pain. He is ready to set up surgery. He denies fever/chills. He has not had any jaundice. He also denies nausea/vomiting.    Other Problems  Diabetes Mellitus Hypercholesterolemia Melanoma  Past Surgical History  No pertinent past surgical history  Allergies  Codeine Phosphate *ANALGESICS - OPIOID* Pravastatin Sodium *ANTIHYPERLIPIDEMICS* Simvastatin *ANTIHYPERLIPIDEMICS*  Medication History Glimepiride (2MG  Tablet, Oral) Active. Lisinopril-Hydrochlorothiazide (20-12.5MG  Tablet, Oral) Active. MetFORMIN HCl (1000MG  Tablet, Oral) Active. Zetia (10MG  Tablet, Oral) Active. Medications Reconciled  Social History  Alcohol use Occasional alcohol use. Caffeine use Carbonated beverages, Coffee. No drug use Tobacco use Never smoker.  Family History Diabetes Mellitus Father. Heart Disease Father. Heart disease in male family member before age 18 Hypertension Father.  Review of Systems  General Present- Fatigue. Not Present- Appetite Loss, Chills, Fever, Night Sweats, Weight Gain and Weight Loss. Skin Not Present- Change in Wart/Mole, Dryness, Hives, Jaundice, New Lesions, Non-Healing Wounds, Rash and Ulcer. HEENT Present- Wears glasses/contact lenses. Not Present- Earache, Hearing Loss, Hoarseness, Nose Bleed, Oral Ulcers, Ringing in the Ears, Seasonal Allergies, Sinus Pain, Sore Throat, Visual Disturbances and Yellow Eyes. Respiratory Not Present- Bloody sputum, Chronic Cough, Difficulty Breathing, Snoring and Wheezing. Breast Not Present- Breast Mass, Breast Pain, Nipple Discharge and Skin  Changes. Cardiovascular Not Present- Chest Pain, Difficulty Breathing Lying Down, Leg Cramps, Palpitations, Rapid Heart Rate, Shortness of Breath and Swelling of Extremities. Gastrointestinal Present- Abdominal Pain. Not Present- Bloating, Bloody Stool, Change in Bowel Habits, Chronic diarrhea, Constipation, Difficulty Swallowing, Excessive gas, Gets full quickly at meals, Hemorrhoids, Indigestion, Nausea, Rectal Pain and Vomiting. Male Genitourinary Not Present- Blood in Urine, Change in Urinary Stream, Frequency, Impotence, Nocturia, Painful Urination, Urgency and Urine Leakage. Musculoskeletal Present- Joint Stiffness. Not Present- Back Pain, Joint Pain, Muscle Pain, Muscle Weakness and Swelling of Extremities. Neurological Not Present- Decreased Memory, Fainting, Headaches, Numbness, Seizures, Tingling, Tremor, Trouble walking and Weakness. Psychiatric Not Present- Anxiety, Bipolar, Change in Sleep Pattern, Depression, Fearful and Frequent crying. Endocrine Not Present- Cold Intolerance, Excessive Hunger, Hair Changes, Heat Intolerance, Hot flashes and New Diabetes. Hematology Not Present- Easy Bruising, Excessive bleeding, Gland problems, HIV and Persistent Infections.   Vitals  Weight: 201 lb Height: 69in Body Surface Area: 2.11 m Body Mass Index: 29.68 kg/m Temp.: 97.83F(Oral)  Pulse: 61 (Regular)  Resp.: 17 (Unlabored)  BP: 128/82 (Sitting, Left Arm, Standard)    Physical Exam  General Mental Status-Alert. General Appearance-Consistent with stated age. Hydration-Well hydrated. Voice-Normal.  Head and Neck Head-normocephalic, atraumatic with no lesions or palpable masses.  Eye Sclera/Conjunctiva - Bilateral-No scleral icterus.  Chest and Lung Exam Chest and lung exam reveals -quiet, even and easy respiratory effort with no use of accessory muscles. Inspection Chest Wall - Normal. Back - normal.  Cardiovascular Cardiovascular examination  reveals -normal pedal pulses bilaterally. Note: regular rate and rhythm  Abdomen Inspection-Inspection Normal. Palpation/Percussion Palpation and Percussion of the abdomen reveal - Soft, No Rebound tenderness, No Rigidity (guarding) and No hepatosplenomegaly. Note: slightly tender RUQ.  Peripheral Vascular Upper Extremity Inspection - Bilateral - Normal - No Clubbing, No Cyanosis, No Edema, Pulses Intact. Lower Extremity Palpation - Edema - Bilateral - No  edema.  Neurologic Neurologic evaluation reveals -alert and oriented x 3 with no impairment of recent or remote memory. Mental Status-Normal.  Musculoskeletal Global Assessment -Note: no gross deformities.  Normal Exam - Left-Upper Extremity Strength Normal and Lower Extremity Strength Normal. Normal Exam - Right-Upper Extremity Strength Normal and Lower Extremity Strength Normal.  Lymphatic Head & Neck  General Head & Neck Lymphatics: Bilateral - Description - Normal. Axillary  General Axillary Region: Bilateral - Description - Normal. Tenderness - Non Tender.    Assessment & Plan  CHRONIC CHOLECYSTITIS WITH CALCULUS (574.10  K80.10) Impression: Will set up surgery. The surgical procedure was described to the patient in detail. The patient was given educational material. I discussed the incision type and location, the location of the gallbladder, the anatomy of the bile ducts and arteries, and the typical progression of surgery. I discussed the possibility of converting to an open operation. I advised of the risks of bleeding, infection, damage to other structures (such as the bile duct, intestine or liver), bile leak, need for other procedures or surgeries, and post op diarrhea/constipation. We discussed the risk of blood clot. We discussed the recovery period and post operative restrictions. The patient was advised against taking blood thinners the week before surgery. Current Plans  Pt Education -  Laparoscopic Cholecystectomy: gallbladder Schedule for Surgery   Signed by Stark Klein, MD

## 2014-09-30 NOTE — Transfer of Care (Signed)
Immediate Anesthesia Transfer of Care Note  Patient: John Beasley  Procedure(s) Performed: Procedure(s): LAPAROSCOPIC CHOLECYSTECTOMY WITH INTRAOPERATIVE CHOLANGIOGRAM (N/A)  Patient Location: PACU  Anesthesia Type:General  Level of Consciousness: awake, alert , oriented and patient cooperative  Airway & Oxygen Therapy: Patient Spontanous Breathing and Patient connected to nasal cannula oxygen  Post-op Assessment: Report given to RN, Post -op Vital signs reviewed and stable and Patient moving all extremities X 4  Post vital signs: Reviewed and stable  Last Vitals:  Filed Vitals:   09/30/14 1230  BP: 155/66  Pulse: 91  Temp: 36.7 C  Resp: 19    Complications: No apparent anesthesia complications

## 2014-09-30 NOTE — Anesthesia Postprocedure Evaluation (Signed)
  Anesthesia Post-op Note  Patient: John Beasley  Procedure(s) Performed: Procedure(s): LAPAROSCOPIC CHOLECYSTECTOMY WITH INTRAOPERATIVE CHOLANGIOGRAM (N/A)  Patient Location: PACU  Anesthesia Type:General  Level of Consciousness: awake  Airway and Oxygen Therapy: Patient Spontanous Breathing and Patient connected to nasal cannula oxygen  Post-op Pain: mild  Post-op Assessment: Post-op Vital signs reviewed, Patient's Cardiovascular Status Stable, Respiratory Function Stable, Patent Airway, No signs of Nausea or vomiting and Pain level controlled              Post-op Vital Signs: Reviewed and stable  Last Vitals:  Filed Vitals:   09/30/14 1310  BP:   Pulse: 69  Temp:   Resp: 22    Complications: No apparent anesthesia complications

## 2014-09-30 NOTE — Discharge Instructions (Signed)
[facility], [place] [phone]  ABDOMINAL SURGERY: POST OP INSTRUCTIONS  Always review your discharge instruction sheet given to you by the facility where your surgery was performed.  IF YOU HAVE DISABILITY OR FAMILY LEAVE FORMS, YOU MUST BRING THEM TO THE OFFICE FOR PROCESSING.  PLEASE DO NOT GIVE THEM TO YOUR DOCTOR.  1. A prescription for pain medication may be given to you upon discharge.  Take your pain medication as prescribed, if needed.  If narcotic pain medicine is not needed, then you may take acetaminophen (Tylenol) or ibuprofen (Advil) as needed. 2. Take your usually prescribed medications unless otherwise directed. 3. If you need a refill on your pain medication, please contact your pharmacy. They will contact our office to request authorization.  Prescriptions will not be filled after 5pm or on week-ends. 4. You should follow a light diet the first few days after arrival home, such as soup and crackers, pudding, etc.unless your doctor has advised otherwise. A high-fiber, low fat diet can be resumed as tolerated.   Be sure to include lots of fluids daily. Most patients will experience some swelling and bruising on the chest and neck area.  Ice packs will help.  Swelling and bruising can take several days to resolve 5. Most patients will experience some swelling and bruising in the area of the incision. Ice pack will help. Swelling and bruising can take several days to resolve..  6. It is common to experience some constipation if taking pain medication after surgery.  Increasing fluid intake and taking a stool softener will usually help or prevent this problem from occurring.  A mild laxative (Milk of Magnesia or Miralax) should be taken according to package directions if there are no bowel movements after 48 hours. 7.   If your surgeon used skin glue on the incision, you may shower in 48 hours.  The glue will flake off over the next 2-3 weeks.  Any sutures or staples  will be removed at the office during your follow-up visit. You may find that a light gauze bandage over your incision may keep your staples from being rubbed or pulled. You may shower and replace the bandage daily. 8. ACTIVITIES:  You may resume regular (light) daily activities beginning the next day--such as daily self-care, walking, climbing stairs--gradually increasing activities as tolerated.  You may have sexual intercourse when it is comfortable.  Refrain from any heavy lifting or straining until approved by your doctor. a. You may drive when you no longer are taking prescription pain medication, you can comfortably wear a seatbelt, and you can safely maneuver your car and apply brakes b. Return to Work: __________not applicable_________________________ 96. You should see your doctor in the office for a follow-up appointment approximately two weeks after your surgery.  Make sure that you call for this appointment within a day or two after you arrive home to insure a convenient appointment time. OTHER INSTRUCTIONS:  _____________________________________________________________ _____________________________________________________________  WHEN TO CALL YOUR DOCTOR: 1. Fever over 101.0 2. Inability to urinate 3. Yellow eyes or very dark urine.   4. Nausea and/or vomiting 5. Extreme swelling or bruising 6. Continued bleeding from incision. 7. Increased pain, redness, or drainage from the incision. 8. Difficulty swallowing or breathing 9. Muscle cramping or spasms. 10. Numbness or tingling in hands or feet or around lips.  The clinic staff is available to answer your questions during regular business hours.  Please dont hesitate to call and ask to  speak to one of the nurses if you have concerns.  For further questions, please visit [email or web address]

## 2014-10-01 ENCOUNTER — Encounter (HOSPITAL_COMMUNITY): Payer: Self-pay | Admitting: General Surgery

## 2014-11-20 ENCOUNTER — Other Ambulatory Visit: Payer: Self-pay | Admitting: General Surgery

## 2014-11-20 DIAGNOSIS — R1084 Generalized abdominal pain: Secondary | ICD-10-CM

## 2014-11-20 NOTE — Addendum Note (Signed)
Addended by: Stark Klein on: 11/20/2014 10:56 AM   Modules accepted: Orders

## 2014-11-23 ENCOUNTER — Emergency Department (HOSPITAL_COMMUNITY)
Admission: EM | Admit: 2014-11-23 | Discharge: 2014-11-23 | Disposition: A | Discharge status: Home / Self Care | Payer: BC Managed Care – PPO | Source: Home / Self Care | Attending: Emergency Medicine | Admitting: Emergency Medicine

## 2014-11-23 ENCOUNTER — Encounter (HOSPITAL_COMMUNITY): Payer: Self-pay | Admitting: Emergency Medicine

## 2014-11-23 DIAGNOSIS — R1011 Right upper quadrant pain: Secondary | ICD-10-CM | POA: Diagnosis not present

## 2014-11-23 NOTE — Discharge Instructions (Signed)
This pain may be coming from her surgical site. It could also be from a tiny stone in the bile system. Take ibuprofen 800 mg 3 times a day as needed for pain. Please call your surgeon's office tomorrow morning to find out when the MRCP will be done. If you develop constant severe abdominal pain, fevers, vomiting, please go directly to the emergency room.

## 2014-11-23 NOTE — ED Notes (Signed)
C/o abd pain States he had surgery on June 28  States spoke with surgeon regards of abd pain and blood work was ordered States blood work came back abnormal  MRI is being scheduled States pain is stabbing on right side area

## 2014-11-23 NOTE — ED Provider Notes (Signed)
CSN: [id number]     Arrival date & time 11/23/14  1557 History   First MD Initiated Contact with Patient 11/23/14 1658     Chief Complaint  Patient presents with  . Abdominal Pain   (Consider location/radiation/quality/duration/timing/severity/associated sxs/prior Treatment) HPI  He is a 64 year old man here for evaluation of right upper quadrant abdominal pain. He states he had gallbladder surgery June 28. He had been doing well up until 4 days ago. At that time, he developed some pains in the right upper quadrant. He called his surgeon's office and they did some blood work. On Thursday, he was notified that his blood work was abnormal. They said they were going to work on getting him scheduled for some type of MRI. He has not heard back from them at this point. He has continued to have intermittent pains over the weekend. Today, around 2:00 his pain became quite severe causing shortness of breath and a clammy feeling. At this time, it has eased up. He states at the peak it was about a 7.5 out of 10. Pain became severe after eating half of a sandwich. He denies any nausea, vomiting, diarrhea, constipation, change in stool. No fevers or chills.  Past Medical History  Diagnosis Date  . Hypertension   . Hyperlipidemia   . GERD (gastroesophageal reflux disease)   . Diabetes mellitus   . Diastolic dysfunction   . Renal cyst   . Renal insufficiency    Past Surgical History  Procedure Laterality Date  . Kidney cyst removal  2007    [place]  . Cholecystectomy N/A 09/30/2014    Procedure: LAPAROSCOPIC CHOLECYSTECTOMY WITH INTRAOPERATIVE CHOLANGIOGRAM;  Surgeon: Stark Klein, MD;  Location: [facility] OR;  Service: General;  Laterality: N/A;   Family History  Problem Relation Age of Onset  . Diabetes    . Hyperlipidemia    . Hypertension    . Stroke    . Skin cancer    . Stroke Mother   . Heart disease Father    Social History  Substance Use Topics  . Smoking status: Never Smoker   . Smokeless  tobacco: Never Used  . Alcohol Use: Yes     Comment: rarely    Review of Systems As in history of present illness Allergies  Codeine; Pravastatin sodium; and Simvastatin  Home Medications   Prior to Admission medications   Medication Sig Start Date End Date Taking? Authorizing Provider  ezetimibe (ZETIA) 10 MG tablet Take 1 tablet (10 mg total) by mouth daily. 04/29/13   Rosalita Chessman, DO  glimepiride (AMARYL) 2 MG tablet Take 2 mg by mouth daily with breakfast.    Historical Provider, MD  lisinopril-hydrochlorothiazide (PRINZIDE,ZESTORETIC) 20-12.5 MG per tablet Take 2 tablets by mouth daily. 04/29/13   Rosalita Chessman, DO  metFORMIN (GLUCOPHAGE) 1000 MG tablet Take 1,000 mg by mouth 2 (two) times daily with a meal.    Historical Provider, MD  Multiple Vitamins-Minerals (MENS MULTIVITAMIN PLUS) TABS Take 1 tablet by mouth daily at 12 noon.    Historical Provider, MD  oxyCODONE-acetaminophen (ROXICET) 5-325 MG per tablet Take 1-2 tablets by mouth every 6 (six) hours as needed for severe pain. 09/30/14   Nat Christen, PA-C   BP 149/71 mmHg  Pulse 71  Temp(Src) 98.5 F (36.9 C) (Oral)  Resp 16  SpO2 100% Physical Exam  Constitutional: He is oriented to person, place, and time. He appears well-developed and well-nourished. No distress.  Neck: Neck supple.  Cardiovascular: Normal  rate.   Pulmonary/Chest: Effort normal.  Abdominal: Soft. Bowel sounds are normal. He exhibits no distension and no mass. There is tenderness (he does have some tenderness to deep palpation in the right upper quadrant). There is no rebound and no guarding.  He does have a midline hernia in the upper abdomen.  Neurological: He is alert and oriented to person, place, and time.    ED Course  Procedures (including critical care time) Labs Review Labs Reviewed - No data to display  Imaging Review No results found.   MDM   1. RUQ pain    Spoke with Dr. Redmond Pulling, general surgeon on call. He reviewed the  patient's labs and noted a mildly elevated ALTs and AST in the 50s. An MRCP has been ordered, but not scheduled yet. Given stable vital signs and benign abdominal exam, recommended outpatient treatment and follow-up MRCP as soon as possible.  Discussed with patient and his wife. He is unable to tolerate narcotic pain medicines as they make him sick. He will take ibuprofen 800 mg every 8 hours as needed for pain. He will call Dr. Marlowe Aschoff office tomorrow morning to find out when the MRCP will be done. Return precautions reviewed.    Melony Overly, MD 11/23/14 [phone]

## 2014-11-24 ENCOUNTER — Other Ambulatory Visit: Payer: Self-pay

## 2014-11-24 DIAGNOSIS — R109 Unspecified abdominal pain: Secondary | ICD-10-CM

## 2014-11-24 DIAGNOSIS — R1084 Generalized abdominal pain: Secondary | ICD-10-CM

## 2014-11-26 ENCOUNTER — Ambulatory Visit (HOSPITAL_COMMUNITY)
Admission: RE | Admit: 2014-11-26 | Discharge: 2014-11-26 | Disposition: A | Discharge status: Home / Self Care | Payer: BC Managed Care – PPO | Source: Ambulatory Visit | Attending: General Surgery | Admitting: General Surgery

## 2014-11-26 DIAGNOSIS — R109 Unspecified abdominal pain: Secondary | ICD-10-CM | POA: Insufficient documentation

## 2014-11-26 DIAGNOSIS — K760 Fatty (change of) liver, not elsewhere classified: Secondary | ICD-10-CM | POA: Diagnosis not present

## 2014-11-26 LAB — POCT I-STAT CREATININE: Creatinine, Ser: 1.1 mg/dL (ref 0.61–1.24)

## 2014-11-26 MED ORDER — GADOBENATE DIMEGLUMINE 529 MG/ML IV SOLN
17.0000 mL | Freq: Once | INTRAVENOUS | Status: AC | PRN
  Administered 2014-11-26: 17 mL via INTRAVENOUS

## 2014-11-27 NOTE — Progress Notes (Signed)
Quick Note:  Please let patient know this looks good. There is no retained stone anywhere or fluid collection. ______

## 2014-12-03 ENCOUNTER — Other Ambulatory Visit: Payer: BC Managed Care – PPO

## 2014-12-04 ENCOUNTER — Other Ambulatory Visit: Payer: BC Managed Care – PPO

## 2016-06-18 IMAGING — CT CT ABD-PELV W/ CM
2 of 5 series · 15 of 46 positions shown, 17 images · IV contrast (READICAT/WATER & [ID] OMNI 300)
Comparison: CT abdomen and pelvis January 01, 2006; abdominal
ultrasound February 12, 2013

CLINICAL DATA: Abdominal pain

EXAM:
CT ABDOMEN AND PELVIS WITH CONTRAST
TECHNIQUE: Multidetector CT imaging of the abdomen and pelvis was performed
using the standard protocol following bolus administration of
intravenous contrast. Oral contrast was also administered.
CONTRAST:  125mL OMNIPAQUE IOHEXOL 300 MG/ML  SOLN

[Series 2: abd/pelvis with · axial · 0.86mm/px · z∈[-447,+18]mm · 12 of 105 slices shown, 14 images]
[im 6/105  soft-tissue]
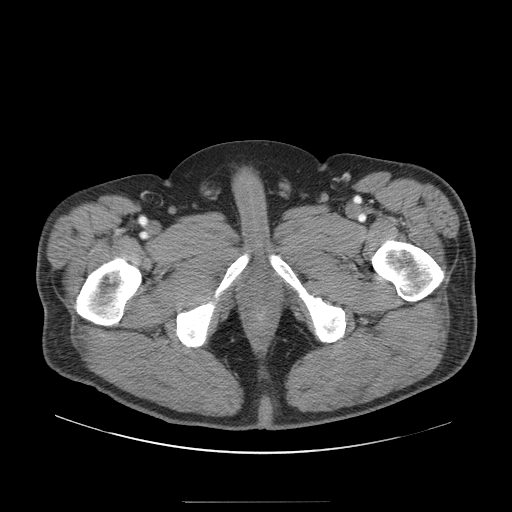
[im 6/105  bone]
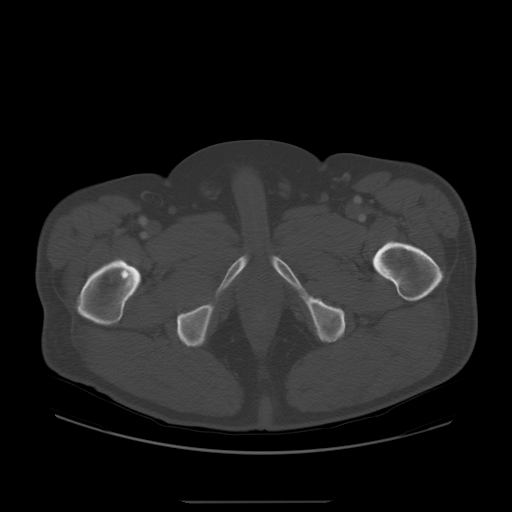
[im 18/105  soft-tissue]
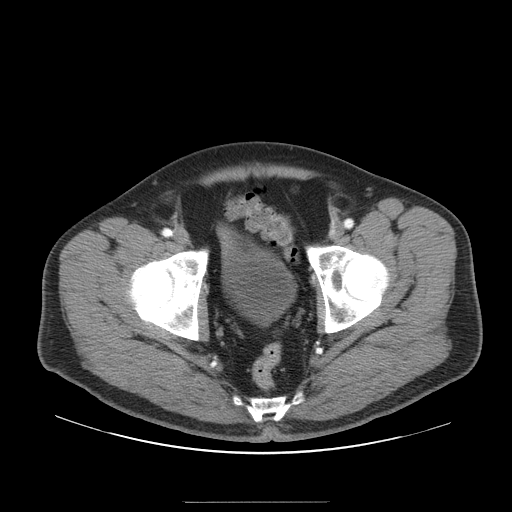
[im 24/105  soft-tissue]
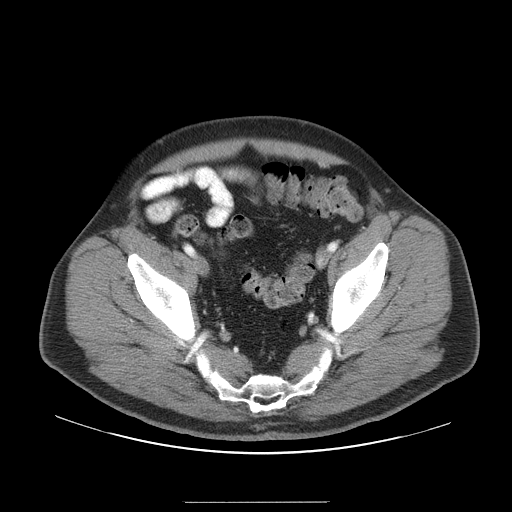
[im 29/105  soft-tissue]
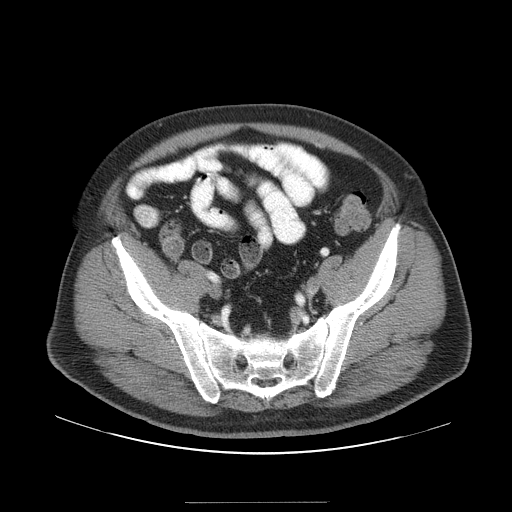
[im 41/105  soft-tissue]
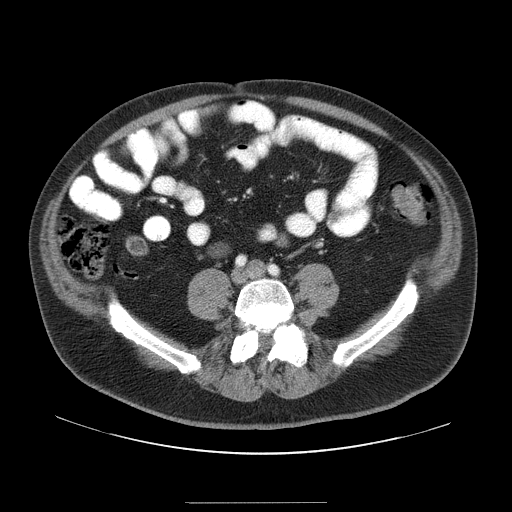
[im 47/105  soft-tissue]
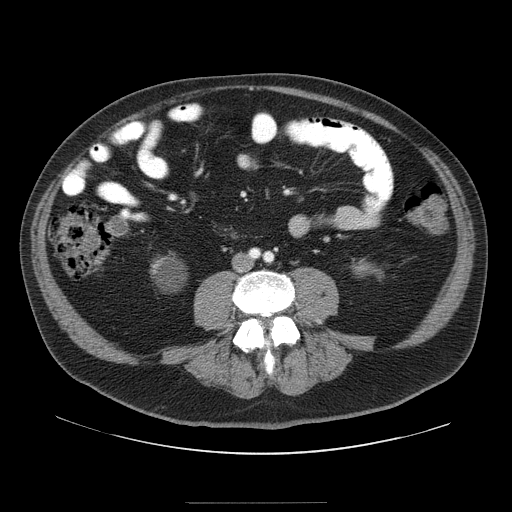
[im 58/105  soft-tissue]
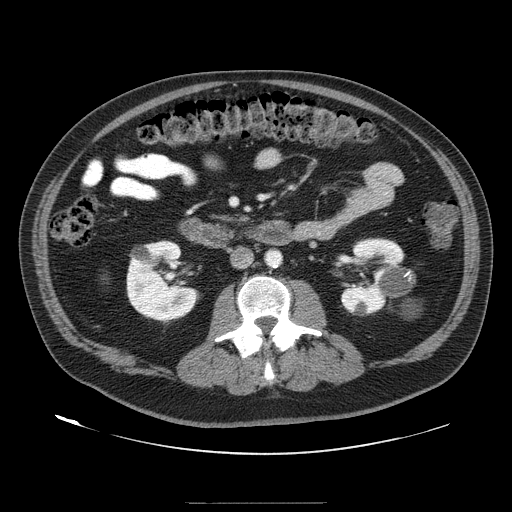
[im 64/105  soft-tissue]
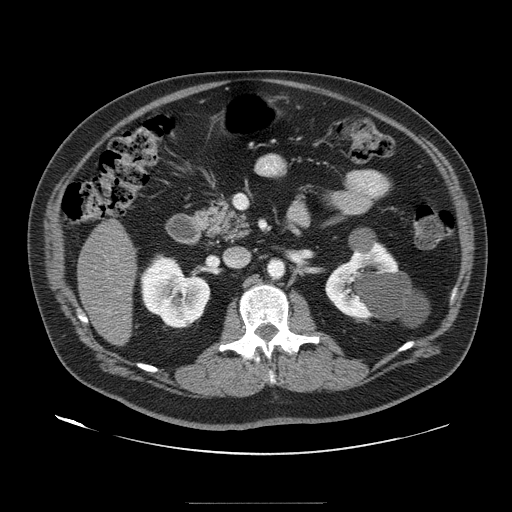
[im 76/105  soft-tissue]
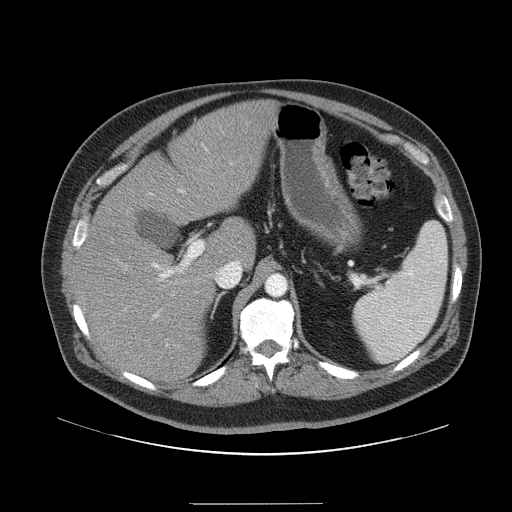
[im 76/105  bone]
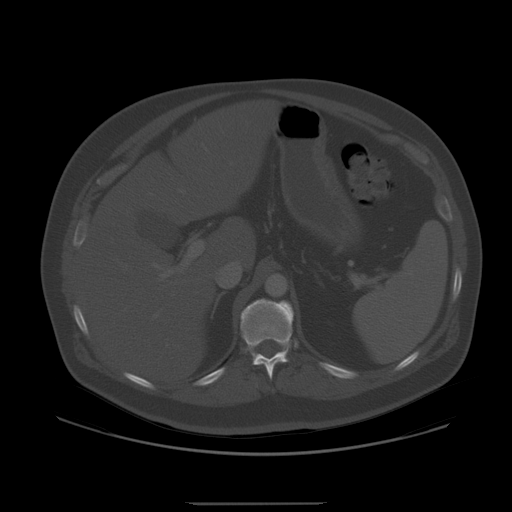
[im 81/105  soft-tissue]
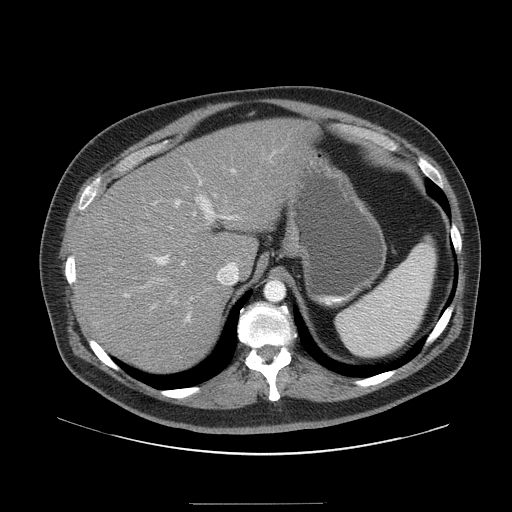
[im 87/105  soft-tissue]
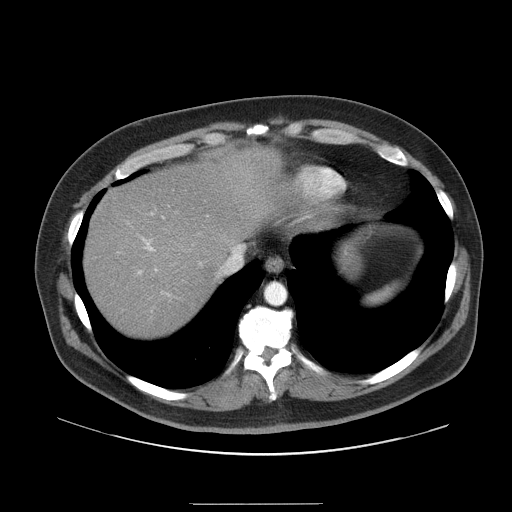
[im 99/105  soft-tissue]
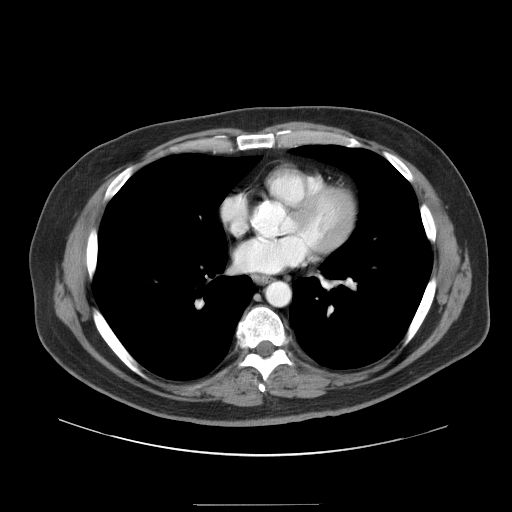

[Series 400: cor · coronal · 1.07mm/px · 3 of 157 slices shown]
[im 53/157  soft-tissue]
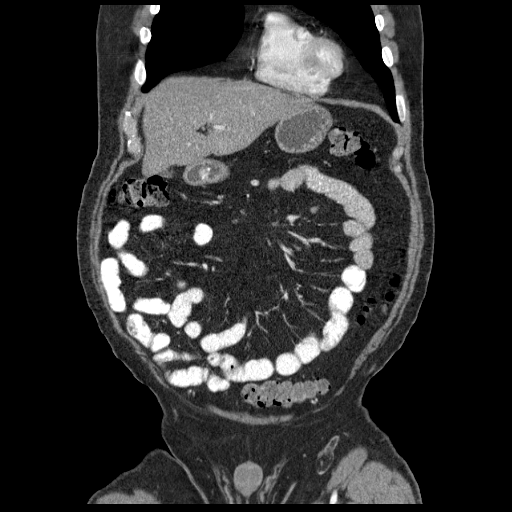
[im 70/157  soft-tissue]
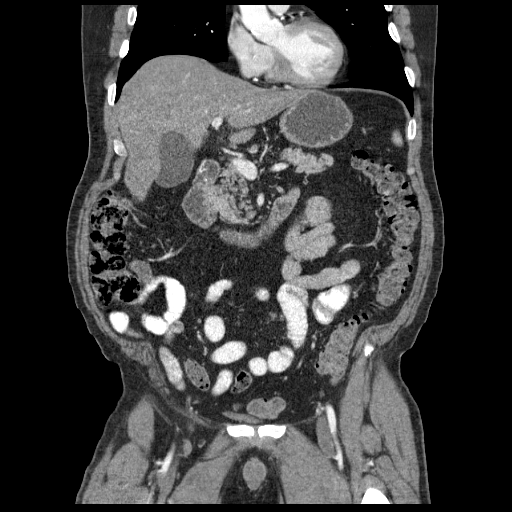
[im 87/157  soft-tissue]
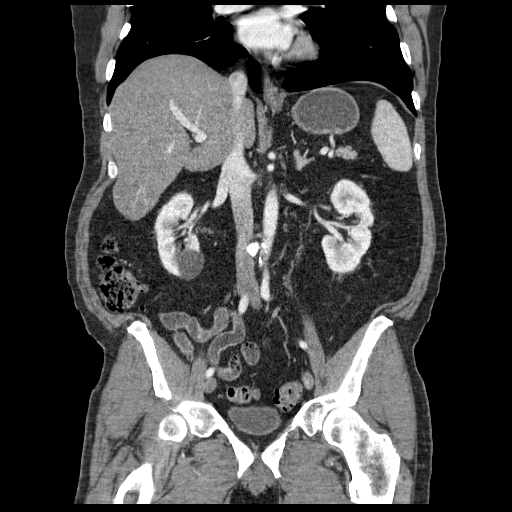

[15 of 46 positions shown; findings below may reference images not displayed]

FINDINGS: On axial slice 6, series 3, there is a 3 mm nodular opacity in the
medial segment of the right middle lobe, stable. Lung bases are
otherwise clear.

There is diffuse fatty change in the liver. No focal liver lesions
are identified. There is cholelithiasis. Gallbladder wall is not
thickened. There is no appreciable biliary duct dilatation.

Spleen, pancreas, and adrenals appear normal.

There are several simple cysts in each kidney. There is a complex
mass in the mid left kidney measuring 3.6 x 2.9 cm which has a
thickened wall and some peripheral calcification. This mass appears
smaller but more complex compared to the 2222 study. The wall of
this mass appears thickened compared to the more recent ultrasound.
Elsewhere, the largest cyst on the right measures 4.1 x 3.9 cm.
There is a septated cystic mass arising from the left kidney
measuring 7.5 by 4.1 cm. There is a mass arising on the left
anteriorly measuring 2.2 x 2.3 cm ; it appears homogeneous by CT but
has attenuation values higher than is expected with a simple cyst.
There is no hydronephrosis in either kidney. There is no renal or
ureteral calculus on either side.

In the pelvis, there is fat in each inguinal ring. The urinary
bladder is midline with normal wall thickness. There is no pelvic
mass or fluid collection. Prostate appears upper normal in size.
There are scattered sigmoid diverticula without diverticulitis.
There is diffuse stool seen throughout the colon. The appendix
appears normal.

There is no bowel obstruction. No free air or portal venous air.
There is no ascites, adenopathy, or abscess in the abdomen or
pelvis. There is atherosclerotic change in the aorta but no
aneurysm. There is degenerative change in the lumbar spine. There
are no blastic or lytic bone lesions.
IMPRESSION: There are masses in the left kidney which cannot be classified as
simple cysts. The largest of these masses measures 3.6 x 2.9 cm.
Further evaluation with pre and post contrast MRI or CT should be
considered. MRI is preferred in younger patients (due to lack of
ionizing radiation) and for evaluating calcified lesion(s). Given
the calcification in one of these masses, MR would be the imaging
study of choice for further evaluation if possible.

Diffuse stool throughout colon. No bowel obstruction. No abscess.
Appendix appears normal.

Extensive arthropathy in the lumbar spine.

Diffuse fatty liver.  There is cholelithiasis.

Small nodular opacity right middle lobe, stable from 2222. This is a
benign lesion given the stability since 2222.

These results will be called to the ordering clinician or
representative by the Radiologist Assistant, and communication
documented in the PACS or zVision Dashboard.

## 2019-05-04 ENCOUNTER — Ambulatory Visit: Payer: BC Managed Care – PPO

## 2019-05-09 ENCOUNTER — Other Ambulatory Visit: Payer: Self-pay | Admitting: Interventional Radiology

## 2019-05-09 ENCOUNTER — Ambulatory Visit: Payer: BC Managed Care – PPO

## 2019-05-09 DIAGNOSIS — N281 Cyst of kidney, acquired: Secondary | ICD-10-CM

## 2019-05-12 ENCOUNTER — Ambulatory Visit: Payer: Medicare PPO | Attending: Internal Medicine

## 2019-05-12 DIAGNOSIS — Z23 Encounter for immunization: Secondary | ICD-10-CM

## 2019-05-12 NOTE — Progress Notes (Signed)
   Covid-19 Vaccination Clinic  Name:  John Beasley    MRN: [id number] DOB: Feb 18, 1951  05/12/2019  Mr. Matsuyama was observed post Covid-19 immunization for 15 minutes without incidence. He was provided with Vaccine Information Sheet and instruction to access the V-Safe system.   Mr. Berrelleza was instructed to call 911 with any severe reactions post vaccine: Marland Kitchen Difficulty breathing  . Swelling of your face and throat  . A fast heartbeat  . A bad rash all over your body  . Dizziness and weakness    Immunizations Administered    Name Date Dose VIS Date Route   Pfizer COVID-19 Vaccine 05/12/2019  3:32 PM 0.3 mL 03/15/2019 Intramuscular   Manufacturer: [place]   Lot: [id number]   [place]: [id number]

## 2019-05-30 ENCOUNTER — Other Ambulatory Visit: Payer: Self-pay

## 2019-05-30 ENCOUNTER — Ambulatory Visit (HOSPITAL_COMMUNITY)
Admission: RE | Admit: 2019-05-30 | Discharge: 2019-05-30 | Disposition: A | Discharge status: Home / Self Care | Payer: Medicare PPO | Source: Ambulatory Visit | Attending: Interventional Radiology | Admitting: Interventional Radiology

## 2019-05-30 DIAGNOSIS — N281 Cyst of kidney, acquired: Secondary | ICD-10-CM | POA: Insufficient documentation

## 2019-05-30 LAB — POCT I-STAT CREATININE: Creatinine, Ser: 1 mg/dL (ref 0.61–1.24)

## 2019-05-30 MED ORDER — GADOBUTROL 1 MMOL/ML IV SOLN
9.0000 mL | Freq: Once | INTRAVENOUS | Status: AC | PRN
  Administered 2019-05-30: 9 mL via INTRAVENOUS

## 2019-06-04 ENCOUNTER — Ambulatory Visit
Admission: RE | Admit: 2019-06-04 | Discharge: 2019-06-04 | Disposition: A | Discharge status: Home / Self Care | Payer: Medicare PPO | Source: Ambulatory Visit | Attending: Interventional Radiology | Admitting: Interventional Radiology

## 2019-06-04 ENCOUNTER — Other Ambulatory Visit: Payer: Self-pay

## 2019-06-04 DIAGNOSIS — N281 Cyst of kidney, acquired: Secondary | ICD-10-CM

## 2019-06-04 HISTORY — PX: IR RADIOLOGIST EVAL & MGMT: IMG5224

## 2019-06-04 NOTE — Progress Notes (Signed)
Patient ID: John Beasley, male   DOB: [birth year], 69 y.o.   MRN: [id number]       Chief Complaint: Patient was consulted remotely today ([place]) for  at the request of [facility].    Referring Physician(s): Aura Dials  History of Present Illness: John Beasley is a 69 y.o. male originally known to our service from 09/18/2003 for aspiration of a painful left renal cyst.  The lesion recurred in 2011 and I repeated ultrasound-guided aspiration along with ethanol ablation on 11/12/2009, with good relief of symptoms.  Recently he began having similar left flank pain rated 5 out of 10 on the visual analog pain scale.  We repeated an MRI to evaluate.  He now states the pain is improved to 2 out of 10 and is not significantly restricting his activities of daily living.  Past Medical History:  Diagnosis Date  . Diabetes mellitus   . Diastolic dysfunction   . GERD (gastroesophageal reflux disease)   . Hyperlipidemia   . Hypertension   . Renal cyst   . Renal insufficiency     Past Surgical History:  Procedure Laterality Date  . CHOLECYSTECTOMY N/A 09/30/2014   Procedure: LAPAROSCOPIC CHOLECYSTECTOMY WITH INTRAOPERATIVE CHOLANGIOGRAM;  Surgeon: Stark Klein, MD;  Location: [facility];  Service: General;  Laterality: N/A;  . KIDNEY CYST REMOVAL  2007   [facility]    Allergies: Codeine, Pravastatin sodium, and Simvastatin  Medications: Prior to Admission medications   Medication Sig Start Date End Date Taking? Authorizing Provider  ezetimibe (ZETIA) 10 MG tablet Take 1 tablet (10 mg total) by mouth daily. 04/29/13   Ann Held, DO  glimepiride (AMARYL) 2 MG tablet Take 2 mg by mouth daily with breakfast.    [provider]  lisinopril-hydrochlorothiazide (PRINZIDE,ZESTORETIC) 20-12.5 MG per tablet Take 2 tablets by mouth daily. 04/29/13   Ann Held, DO  metFORMIN (GLUCOPHAGE) 1000 MG tablet Take 1,000 mg by mouth 2 (two) times daily with a meal.    [provider]  Multiple Vitamins-Minerals (MENS MULTIVITAMIN PLUS) TABS Take 1 tablet by mouth daily at 12 noon.    [provider]  oxyCODONE-acetaminophen (ROXICET) 5-325 MG per tablet Take 1-2 tablets by mouth every 6 (six) hours as needed for severe pain. 09/30/14   Nat Christen, PA-C     Family History  Problem Relation Age of Onset  . Diabetes Unknown   . Hyperlipidemia Unknown   . Hypertension Unknown   . Stroke Unknown   . Skin cancer Unknown   . Stroke Mother   . Heart disease Father     Social History   Socioeconomic History  . Marital status: Married    Spouse name: Not on file  . Number of children: Not on file  . Years of education: Not on file  . Highest education level: Not on file  Occupational History  . Not on file  Tobacco Use  . Smoking status: Never Smoker  . Smokeless tobacco: Never Used  Substance and Sexual Activity  . Alcohol use: Yes    Comment: rarely  . Drug use: No  . Sexual activity: Not on file  Other Topics Concern  . Not on file  Social History Narrative  . Not on file   Social Determinants of Health   Financial Resource Strain:   . Difficulty of Paying Living Expenses: Not on file  Food Insecurity:   . Worried About Charity fundraiser in the Last Year:  Not on file  . Ran Out of Food in the Last Year: Not on file  Transportation Needs:   . Lack of Transportation (Medical): Not on file  . Lack of Transportation (Non-Medical): Not on file  Physical Activity:   . Days of Exercise per Week: Not on file  . Minutes of Exercise per Session: Not on file  Stress:   . Feeling of Stress : Not on file  Social Connections:   . Frequency of Communication with Friends and Family: Not on file  . Frequency of Social Gatherings with Friends and Family: Not on file  . Attends Religious Services: Not on file  . Active Member of Clubs or Organizations: Not on file  . Attends Archivist Meetings: Not on file  . Marital  Status: Not on file    ECOG Status: 1 - Symptomatic but completely ambulatory  Review of Systems  Review of Systems: A 12 point ROS discussed and pertinent positives are indicated in the HPI above.  All other systems are negative.  Physical Exam No direct physical exam was performed (except for noted visual exam findings with Video Visits).     Vital Signs: There were no vitals taken for this visit.  Imaging: MR ABDOMEN WWO CONTRAST  Result Date: 05/30/2019 CLINICAL DATA:  Follow-up kidney cysts. EXAM: MRI ABDOMEN WITHOUT AND WITH CONTRAST TECHNIQUE: Multiplanar multisequence MR imaging of the abdomen was performed both before and after the administration of intravenous contrast. CONTRAST:  41mL GADAVIST GADOBUTROL 1 MMOL/ML IV SOLN COMPARISON:  MRI 11/26/2014 FINDINGS: Lower chest: No acute findings. Hepatobiliary: Diffuse hepatic steatosis identified. There is no suspicious liver abnormality. Following the IV administration of contrast no abnormal enhancement identified. Status post cholecystectomy. No common bile duct dilatation identified. Pancreas: No mass, inflammatory changes, or other parenchymal abnormality identified. Spleen:  Within normal limits in size and appearance. Adrenals/Urinary Tract: The adrenal glands appear within normal limits. Bilateral kidney cysts are again identified. Most of the cysts have a simple appearance. The largest cyst in the right kidney arises from the inferior pole measuring 4.9 cm. Previously characterized Bosniak category 40F lesion arising from the lateral mid left kidney measures 6.7 cm, image 38/9. Previously 6.6 cm. This contains several thin, hairline septation, which appear identical to study from 11/26/2014. No mural nodularity or progressive septal thickening. Adjacent lesion is identified also contains internal septation. This measures 3.4 cm, unchanged in size from previous exam, image 21/2. On the subtraction images there is no convincing evidence  for internal septation within this lesion. No solid enhancing kidney lesions identified at this time. Stomach/Bowel: Visualized portions within the abdomen are unremarkable. Vascular/Lymphatic: Aortic atherosclerosis. No aneurysm. No abdominal adenopathy. Other:  There is no ascites. Musculoskeletal: No suspicious bone lesions identified. IMPRESSION: 1. Bilateral kidney cysts are again identified. The previously characterized Bosniak category 9f lesion arising from the lateral cortex is unchanged from previous exam. Adjacent mildly complicated cyst is also unchanged compatible with a Bosniak category 33f lesion. Follow-up imaging with repeat MRI in 12 months without and with contrast material recommended. 2. No new findings. 3.  Aortic Atherosclerosis (ICD10-I70.0). 4. Hepatic steatosis. Electronically Signed   By: Kerby Moors M.D.   On: 05/30/2019 13:31    Labs:  CBC: No results for input(s): WBC, HGB, HCT, PLT in the last 8760 hours.  COAGS: No results for input(s): INR, APTT in the last 8760 hours.  BMP: Recent Labs    05/30/19 1017  CREATININE 1.00    LIVER  FUNCTION TESTS: No results for input(s): BILITOT, AST, ALT, ALKPHOS, PROT, ALBUMIN in the last 8760 hours.  TUMOR MARKERS: No results for input(s): AFPTM, CEA, CA199, CHROMGRNA in the last 8760 hours.  Assessment and Plan:  My impression is that the patient has stable benign renal cysts which we have seen on scans dating back to 2007.  There is not been any significant enlargement of cyst since 2015 to suggest an etiology of his left flank pain.  No indication for aspiration or ablation.  Limited evaluation of lumbar spine afforded by the MR shows some facet disease, no significant degenerative disc disease.  No regional mass, adenopathy, or inflammatory change.  I am not sure what caused his pain but given his symptoms are not particularly limiting at this point, will take a very conservative approach.  Reviewed the MRI findings  with the patient and ask appropriate questions which were answered.  Given the presence and stability of the renal cysts for greater than 10 years, no continued surveillance imaging is warranted unless the patient has new symptoms.  Thank you for this interesting consult.  I greatly enjoyed meeting John Beasley and look forward to participating in their care.  A copy of this report was sent to the requesting provider on this date.  Electronically Signed: Rickard Rhymes 06/04/2019, 2:30 PM   I spent a total of    15 Minutes in remote  clinical consultation, greater than 50% of which was counseling/coordinating care for bilateral renal cysts.    Visit type: Audio only (telephone). Audio (no video) only due to patient preference. Alternative for in-person consultation at [facility], [place] [address], [place], [place]. This visit type was conducted due to national recommendations for restrictions regarding the COVID-19 Pandemic (e.g. social distancing).  This format is felt to be most appropriate for this patient at this time.  All issues noted in this document were discussed and addressed.

## 2019-06-05 ENCOUNTER — Encounter: Payer: Self-pay | Admitting: *Deleted

## 2019-06-06 ENCOUNTER — Ambulatory Visit: Payer: Medicare PPO | Attending: Internal Medicine

## 2019-06-06 DIAGNOSIS — Z23 Encounter for immunization: Secondary | ICD-10-CM | POA: Insufficient documentation

## 2019-06-06 NOTE — Progress Notes (Signed)
   Covid-19 Vaccination Clinic  Name:  John Beasley    MRN: [id number] DOB: 1951/02/27  06/06/2019  John Beasley was observed post Covid-19 immunization for 30 minutes based on pre-vaccination screening without incident. He was provided with Vaccine Information Sheet and instruction to access the V-Safe system.   John Beasley was instructed to call 911 with any severe reactions post vaccine: Marland Kitchen Difficulty breathing  . Swelling of face and throat  . A fast heartbeat  . A bad rash all over body  . Dizziness and weakness   Immunizations Administered    Name Date Dose VIS Date Route   Pfizer COVID-19 Vaccine 06/06/2019  9:12 AM 0.3 mL 03/15/2019 Intramuscular   Manufacturer: [place]   Lot: [id number]   [place]: [id number]

## 2019-09-03 DIAGNOSIS — L570 Actinic keratosis: Secondary | ICD-10-CM | POA: Diagnosis not present

## 2019-09-03 DIAGNOSIS — M7138 Other bursal cyst, other site: Secondary | ICD-10-CM | POA: Diagnosis not present

## 2019-09-03 DIAGNOSIS — D044 Carcinoma in situ of skin of scalp and neck: Secondary | ICD-10-CM | POA: Diagnosis not present

## 2019-10-08 DIAGNOSIS — E785 Hyperlipidemia, unspecified: Secondary | ICD-10-CM | POA: Diagnosis not present

## 2019-10-08 DIAGNOSIS — I10 Essential (primary) hypertension: Secondary | ICD-10-CM | POA: Diagnosis not present

## 2019-10-08 DIAGNOSIS — E1165 Type 2 diabetes mellitus with hyperglycemia: Secondary | ICD-10-CM | POA: Diagnosis not present

## 2019-10-08 DIAGNOSIS — E1140 Type 2 diabetes mellitus with diabetic neuropathy, unspecified: Secondary | ICD-10-CM | POA: Diagnosis not present

## 2019-10-08 DIAGNOSIS — Z6829 Body mass index (BMI) 29.0-29.9, adult: Secondary | ICD-10-CM | POA: Diagnosis not present

## 2019-12-19 DIAGNOSIS — W57XXXA Bitten or stung by nonvenomous insect and other nonvenomous arthropods, initial encounter: Secondary | ICD-10-CM | POA: Diagnosis not present

## 2019-12-19 DIAGNOSIS — S90464A Insect bite (nonvenomous), right lesser toe(s), initial encounter: Secondary | ICD-10-CM | POA: Diagnosis not present

## 2020-01-14 DIAGNOSIS — R1032 Left lower quadrant pain: Secondary | ICD-10-CM | POA: Diagnosis not present

## 2020-01-14 DIAGNOSIS — E785 Hyperlipidemia, unspecified: Secondary | ICD-10-CM | POA: Diagnosis not present

## 2020-01-14 DIAGNOSIS — Z23 Encounter for immunization: Secondary | ICD-10-CM | POA: Diagnosis not present

## 2020-01-14 DIAGNOSIS — L309 Dermatitis, unspecified: Secondary | ICD-10-CM | POA: Diagnosis not present

## 2020-01-14 DIAGNOSIS — E119 Type 2 diabetes mellitus without complications: Secondary | ICD-10-CM | POA: Diagnosis not present

## 2020-01-14 DIAGNOSIS — I10 Essential (primary) hypertension: Secondary | ICD-10-CM | POA: Diagnosis not present

## 2020-01-29 DIAGNOSIS — E1140 Type 2 diabetes mellitus with diabetic neuropathy, unspecified: Secondary | ICD-10-CM | POA: Diagnosis not present

## 2020-02-03 DIAGNOSIS — E1140 Type 2 diabetes mellitus with diabetic neuropathy, unspecified: Secondary | ICD-10-CM | POA: Diagnosis not present

## 2020-02-03 DIAGNOSIS — D485 Neoplasm of uncertain behavior of skin: Secondary | ICD-10-CM | POA: Diagnosis not present

## 2020-02-03 DIAGNOSIS — C4442 Squamous cell carcinoma of skin of scalp and neck: Secondary | ICD-10-CM | POA: Diagnosis not present

## 2020-02-03 DIAGNOSIS — E1165 Type 2 diabetes mellitus with hyperglycemia: Secondary | ICD-10-CM | POA: Diagnosis not present

## 2020-02-03 DIAGNOSIS — I10 Essential (primary) hypertension: Secondary | ICD-10-CM | POA: Diagnosis not present

## 2020-02-03 DIAGNOSIS — Z6829 Body mass index (BMI) 29.0-29.9, adult: Secondary | ICD-10-CM | POA: Diagnosis not present

## 2020-02-03 DIAGNOSIS — E785 Hyperlipidemia, unspecified: Secondary | ICD-10-CM | POA: Diagnosis not present

## 2020-02-03 DIAGNOSIS — R809 Proteinuria, unspecified: Secondary | ICD-10-CM | POA: Diagnosis not present

## 2020-02-03 DIAGNOSIS — L570 Actinic keratosis: Secondary | ICD-10-CM | POA: Diagnosis not present

## 2020-02-04 DIAGNOSIS — E119 Type 2 diabetes mellitus without complications: Secondary | ICD-10-CM | POA: Diagnosis not present

## 2020-02-04 DIAGNOSIS — H2513 Age-related nuclear cataract, bilateral: Secondary | ICD-10-CM | POA: Diagnosis not present

## 2020-02-04 DIAGNOSIS — H5203 Hypermetropia, bilateral: Secondary | ICD-10-CM | POA: Diagnosis not present

## 2020-02-11 DIAGNOSIS — R1032 Left lower quadrant pain: Secondary | ICD-10-CM | POA: Diagnosis not present

## 2020-02-18 DIAGNOSIS — L570 Actinic keratosis: Secondary | ICD-10-CM | POA: Diagnosis not present

## 2020-02-18 DIAGNOSIS — D485 Neoplasm of uncertain behavior of skin: Secondary | ICD-10-CM | POA: Diagnosis not present

## 2020-02-18 DIAGNOSIS — C4442 Squamous cell carcinoma of skin of scalp and neck: Secondary | ICD-10-CM | POA: Diagnosis not present

## 2020-02-18 DIAGNOSIS — D0422 Carcinoma in situ of skin of left ear and external auricular canal: Secondary | ICD-10-CM | POA: Diagnosis not present

## 2020-03-12 DIAGNOSIS — L570 Actinic keratosis: Secondary | ICD-10-CM | POA: Diagnosis not present

## 2020-03-12 DIAGNOSIS — D485 Neoplasm of uncertain behavior of skin: Secondary | ICD-10-CM | POA: Diagnosis not present

## 2020-03-12 DIAGNOSIS — D044 Carcinoma in situ of skin of scalp and neck: Secondary | ICD-10-CM | POA: Diagnosis not present

## 2020-03-12 DIAGNOSIS — C4442 Squamous cell carcinoma of skin of scalp and neck: Secondary | ICD-10-CM | POA: Diagnosis not present

## 2020-03-12 DIAGNOSIS — L905 Scar conditions and fibrosis of skin: Secondary | ICD-10-CM | POA: Diagnosis not present

## 2020-05-11 DIAGNOSIS — E1165 Type 2 diabetes mellitus with hyperglycemia: Secondary | ICD-10-CM | POA: Diagnosis not present

## 2020-05-12 DIAGNOSIS — D0421 Carcinoma in situ of skin of right ear and external auricular canal: Secondary | ICD-10-CM | POA: Diagnosis not present

## 2020-05-12 DIAGNOSIS — S01301A Unspecified open wound of right ear, initial encounter: Secondary | ICD-10-CM | POA: Diagnosis not present

## 2020-05-14 DIAGNOSIS — E785 Hyperlipidemia, unspecified: Secondary | ICD-10-CM | POA: Diagnosis not present

## 2020-05-14 DIAGNOSIS — R809 Proteinuria, unspecified: Secondary | ICD-10-CM | POA: Diagnosis not present

## 2020-05-14 DIAGNOSIS — E1165 Type 2 diabetes mellitus with hyperglycemia: Secondary | ICD-10-CM | POA: Diagnosis not present

## 2020-05-14 DIAGNOSIS — Z6829 Body mass index (BMI) 29.0-29.9, adult: Secondary | ICD-10-CM | POA: Diagnosis not present

## 2020-05-14 DIAGNOSIS — I10 Essential (primary) hypertension: Secondary | ICD-10-CM | POA: Diagnosis not present

## 2020-05-14 DIAGNOSIS — E1140 Type 2 diabetes mellitus with diabetic neuropathy, unspecified: Secondary | ICD-10-CM | POA: Diagnosis not present

## 2020-06-02 DIAGNOSIS — D485 Neoplasm of uncertain behavior of skin: Secondary | ICD-10-CM | POA: Diagnosis not present

## 2020-06-02 DIAGNOSIS — L570 Actinic keratosis: Secondary | ICD-10-CM | POA: Diagnosis not present

## 2020-06-02 DIAGNOSIS — D0422 Carcinoma in situ of skin of left ear and external auricular canal: Secondary | ICD-10-CM | POA: Diagnosis not present

## 2020-06-02 DIAGNOSIS — L905 Scar conditions and fibrosis of skin: Secondary | ICD-10-CM | POA: Diagnosis not present

## 2020-07-08 DIAGNOSIS — Z794 Long term (current) use of insulin: Secondary | ICD-10-CM | POA: Diagnosis not present

## 2020-07-08 DIAGNOSIS — E785 Hyperlipidemia, unspecified: Secondary | ICD-10-CM | POA: Diagnosis not present

## 2020-07-08 DIAGNOSIS — E119 Type 2 diabetes mellitus without complications: Secondary | ICD-10-CM | POA: Diagnosis not present

## 2020-07-08 DIAGNOSIS — I10 Essential (primary) hypertension: Secondary | ICD-10-CM | POA: Diagnosis not present

## 2020-07-30 DIAGNOSIS — D044 Carcinoma in situ of skin of scalp and neck: Secondary | ICD-10-CM | POA: Diagnosis not present

## 2020-07-30 DIAGNOSIS — L570 Actinic keratosis: Secondary | ICD-10-CM | POA: Diagnosis not present

## 2020-08-26 DIAGNOSIS — W57XXXA Bitten or stung by nonvenomous insect and other nonvenomous arthropods, initial encounter: Secondary | ICD-10-CM | POA: Diagnosis not present

## 2020-08-26 DIAGNOSIS — S30861A Insect bite (nonvenomous) of abdominal wall, initial encounter: Secondary | ICD-10-CM | POA: Diagnosis not present

## 2020-10-16 DIAGNOSIS — D485 Neoplasm of uncertain behavior of skin: Secondary | ICD-10-CM | POA: Diagnosis not present

## 2020-10-16 DIAGNOSIS — L570 Actinic keratosis: Secondary | ICD-10-CM | POA: Diagnosis not present

## 2020-10-16 DIAGNOSIS — C44629 Squamous cell carcinoma of skin of left upper limb, including shoulder: Secondary | ICD-10-CM | POA: Diagnosis not present

## 2020-11-11 DIAGNOSIS — Z6829 Body mass index (BMI) 29.0-29.9, adult: Secondary | ICD-10-CM | POA: Diagnosis not present

## 2020-11-11 DIAGNOSIS — E1165 Type 2 diabetes mellitus with hyperglycemia: Secondary | ICD-10-CM | POA: Diagnosis not present

## 2020-11-11 DIAGNOSIS — I10 Essential (primary) hypertension: Secondary | ICD-10-CM | POA: Diagnosis not present

## 2020-11-11 DIAGNOSIS — E785 Hyperlipidemia, unspecified: Secondary | ICD-10-CM | POA: Diagnosis not present

## 2020-11-11 DIAGNOSIS — R809 Proteinuria, unspecified: Secondary | ICD-10-CM | POA: Diagnosis not present

## 2020-11-11 DIAGNOSIS — E1140 Type 2 diabetes mellitus with diabetic neuropathy, unspecified: Secondary | ICD-10-CM | POA: Diagnosis not present

## 2020-11-18 DIAGNOSIS — C44329 Squamous cell carcinoma of skin of other parts of face: Secondary | ICD-10-CM | POA: Diagnosis not present

## 2020-11-18 DIAGNOSIS — D485 Neoplasm of uncertain behavior of skin: Secondary | ICD-10-CM | POA: Diagnosis not present

## 2020-11-18 DIAGNOSIS — C44629 Squamous cell carcinoma of skin of left upper limb, including shoulder: Secondary | ICD-10-CM | POA: Diagnosis not present

## 2020-12-01 DIAGNOSIS — L570 Actinic keratosis: Secondary | ICD-10-CM | POA: Diagnosis not present

## 2021-01-14 DIAGNOSIS — E785 Hyperlipidemia, unspecified: Secondary | ICD-10-CM | POA: Diagnosis not present

## 2021-01-14 DIAGNOSIS — E119 Type 2 diabetes mellitus without complications: Secondary | ICD-10-CM | POA: Diagnosis not present

## 2021-01-14 DIAGNOSIS — C44329 Squamous cell carcinoma of skin of other parts of face: Secondary | ICD-10-CM | POA: Diagnosis not present

## 2021-01-14 DIAGNOSIS — Z23 Encounter for immunization: Secondary | ICD-10-CM | POA: Diagnosis not present

## 2021-01-14 DIAGNOSIS — G720 Drug-induced myopathy: Secondary | ICD-10-CM | POA: Diagnosis not present

## 2021-01-14 DIAGNOSIS — I10 Essential (primary) hypertension: Secondary | ICD-10-CM | POA: Diagnosis not present

## 2021-01-14 DIAGNOSIS — Z7984 Long term (current) use of oral hypoglycemic drugs: Secondary | ICD-10-CM | POA: Diagnosis not present

## 2021-01-14 DIAGNOSIS — I700 Atherosclerosis of aorta: Secondary | ICD-10-CM | POA: Diagnosis not present

## 2021-02-02 DIAGNOSIS — Z85828 Personal history of other malignant neoplasm of skin: Secondary | ICD-10-CM | POA: Diagnosis not present

## 2021-02-02 DIAGNOSIS — L304 Erythema intertrigo: Secondary | ICD-10-CM | POA: Diagnosis not present

## 2021-02-02 DIAGNOSIS — D225 Melanocytic nevi of trunk: Secondary | ICD-10-CM | POA: Diagnosis not present

## 2021-02-02 DIAGNOSIS — L570 Actinic keratosis: Secondary | ICD-10-CM | POA: Diagnosis not present

## 2021-02-02 DIAGNOSIS — Z08 Encounter for follow-up examination after completed treatment for malignant neoplasm: Secondary | ICD-10-CM | POA: Diagnosis not present

## 2021-02-02 DIAGNOSIS — L814 Other melanin hyperpigmentation: Secondary | ICD-10-CM | POA: Diagnosis not present

## 2021-02-19 DIAGNOSIS — I10 Essential (primary) hypertension: Secondary | ICD-10-CM | POA: Diagnosis not present

## 2021-03-16 DIAGNOSIS — L570 Actinic keratosis: Secondary | ICD-10-CM | POA: Diagnosis not present

## 2021-05-31 DIAGNOSIS — E1165 Type 2 diabetes mellitus with hyperglycemia: Secondary | ICD-10-CM | POA: Diagnosis not present

## 2021-07-12 DIAGNOSIS — E785 Hyperlipidemia, unspecified: Secondary | ICD-10-CM | POA: Diagnosis not present

## 2021-07-12 DIAGNOSIS — E1140 Type 2 diabetes mellitus with diabetic neuropathy, unspecified: Secondary | ICD-10-CM | POA: Diagnosis not present

## 2021-07-12 DIAGNOSIS — Z6829 Body mass index (BMI) 29.0-29.9, adult: Secondary | ICD-10-CM | POA: Diagnosis not present

## 2021-07-12 DIAGNOSIS — E1165 Type 2 diabetes mellitus with hyperglycemia: Secondary | ICD-10-CM | POA: Diagnosis not present

## 2021-07-12 DIAGNOSIS — R809 Proteinuria, unspecified: Secondary | ICD-10-CM | POA: Diagnosis not present

## 2021-07-12 DIAGNOSIS — I10 Essential (primary) hypertension: Secondary | ICD-10-CM | POA: Diagnosis not present

## 2021-09-30 DIAGNOSIS — B079 Viral wart, unspecified: Secondary | ICD-10-CM | POA: Diagnosis not present

## 2021-09-30 DIAGNOSIS — L570 Actinic keratosis: Secondary | ICD-10-CM | POA: Diagnosis not present

## 2021-09-30 DIAGNOSIS — D485 Neoplasm of uncertain behavior of skin: Secondary | ICD-10-CM | POA: Diagnosis not present

## 2021-09-30 DIAGNOSIS — L304 Erythema intertrigo: Secondary | ICD-10-CM | POA: Diagnosis not present

## 2021-10-04 DIAGNOSIS — M542 Cervicalgia: Secondary | ICD-10-CM | POA: Diagnosis not present

## 2021-10-04 DIAGNOSIS — I10 Essential (primary) hypertension: Secondary | ICD-10-CM | POA: Diagnosis not present

## 2021-10-04 DIAGNOSIS — E785 Hyperlipidemia, unspecified: Secondary | ICD-10-CM | POA: Diagnosis not present

## 2021-10-04 DIAGNOSIS — R5383 Other fatigue: Secondary | ICD-10-CM | POA: Diagnosis not present

## 2021-10-04 DIAGNOSIS — M25552 Pain in left hip: Secondary | ICD-10-CM | POA: Diagnosis not present

## 2021-10-04 DIAGNOSIS — E119 Type 2 diabetes mellitus without complications: Secondary | ICD-10-CM | POA: Diagnosis not present

## 2021-10-04 DIAGNOSIS — G720 Drug-induced myopathy: Secondary | ICD-10-CM | POA: Diagnosis not present

## 2021-10-04 DIAGNOSIS — I700 Atherosclerosis of aorta: Secondary | ICD-10-CM | POA: Diagnosis not present

## 2021-10-11 DIAGNOSIS — M25552 Pain in left hip: Secondary | ICD-10-CM | POA: Diagnosis not present

## 2021-10-11 DIAGNOSIS — M25562 Pain in left knee: Secondary | ICD-10-CM | POA: Diagnosis not present

## 2021-10-23 NOTE — Progress Notes (Unsigned)
Cardiology Office Note:    Date:  10/27/2021   ID:  John Beasley, DOB [birth year], MRN [id number]  PCP:  Aura Dials, MD  Cardiologist:  Donato Heinz, MD  Electrophysiologist:  None   Referring MD: Aura Dials, MD   Chief Complaint  Patient presents with   Chest Pain    Pressure and/or tingling in left arm/hand    Fatigue    History of Present Illness:    John Beasley is a 71 y.o. male with a hx of T2DM, hypertension, hyperlipidemia who is referred by Dr. Sheryn Bison for evaluation of chest pain.  He reports that he is feeling very fatigued and having shortness of breath with exertion.  In addition about 1-2 times per month is having left-sided chest pain.  Describes as sharp pain that can radiate to left arm.  Short duration.  No clear relationship with exertion.  Reports some lightheadedness but denies any syncope.  Denies any lower extremity edema.  Reports rare palpitations that lasts for a second.  Reports he has been told he snores and feels tired throughout the day.  No smoking history.  Family history includes father had PCI in 39s had CHF.   Past Medical History:  Diagnosis Date   Diabetes mellitus    Diastolic dysfunction    GERD (gastroesophageal reflux disease)    Hyperlipidemia    Hypertension    Renal cyst    Renal insufficiency     Past Surgical History:  Procedure Laterality Date   CHOLECYSTECTOMY N/A 09/30/2014   Procedure: LAPAROSCOPIC CHOLECYSTECTOMY WITH INTRAOPERATIVE CHOLANGIOGRAM;  Surgeon: Stark Klein, MD;  Location: [facility];  Service: General;  Laterality: N/A;   IR RADIOLOGIST EVAL & MGMT  06/04/2019   KIDNEY CYST REMOVAL  2007   [place]    Current Medications: Current Meds  Medication Sig   Dulaglutide (TRULICITY) 1.5 QQ/7.6PP SOPN Inject 1.5 mg into the skin once a week.   ezetimibe (ZETIA) 10 MG tablet Take 1 tablet (10 mg total) by mouth daily.   gabapentin (NEURONTIN) 100 MG capsule Take 100 mg by mouth as needed (neck pressure).    insulin glargine, 1 Unit Dial, (TOUJEO SOLOSTAR) 300 UNIT/ML Solostar Pen 20 Units See admin instructions.   lisinopril-hydrochlorothiazide (PRINZIDE,ZESTORETIC) 20-12.5 MG per tablet Take 2 tablets by mouth daily.   metFORMIN (GLUCOPHAGE) 1000 MG tablet Take 1,000 mg by mouth 2 (two) times daily with a meal.   metoprolol tartrate (LOPRESSOR) 25 MG tablet Take 1 tablet (25 mg) TWO hours prior to CT scan   Multiple Vitamins-Minerals (MENS MULTIVITAMIN PLUS) TABS Take 1 tablet by mouth daily at 12 noon.     Allergies:   Codeine, Pravastatin sodium, and Simvastatin   Social History   Socioeconomic History   Marital status: Married    Spouse name: Not on file   Number of children: Not on file   Years of education: Not on file   Highest education level: Not on file  Occupational History   Not on file  Tobacco Use   Smoking status: Never   Smokeless tobacco: Never  Substance and Sexual Activity   Alcohol use: Yes    Comment: rarely   Drug use: No   Sexual activity: Not on file  Other Topics Concern   Not on file  Social History Narrative   Not on file   Social Determinants of Health   Financial Resource Strain: Not on file  Food Insecurity: Not on file  Transportation Needs: Not on  file  Physical Activity: Not on file  Stress: Not on file  Social Connections: Not on file     Family History: The patient's family history includes Diabetes in an other family member; Heart disease in his father; Hyperlipidemia in an other family member; Hypertension in an other family member; Skin cancer in an other family member; Stroke in his mother and another family member.  ROS:   Please see the history of present illness.     All other systems reviewed and are negative.  EKGs/Labs/Other Studies Reviewed:    The following studies were reviewed today:   EKG:   10/27/2021: Normal sinus rhythm, rate 63, nonspecific T wave flattening, poor R wave progression  Recent Labs: No results  found for requested labs within last 365 days.  Recent Lipid Panel    Component Value Date/Time   CHOL 194 02/05/2013 0949   TRIG 170.0 (H) 02/05/2013 0949   HDL 34.60 (L) 02/05/2013 0949   CHOLHDL 6 02/05/2013 0949   VLDL 34.0 02/05/2013 0949   LDLCALC 125 (H) 02/05/2013 0949   LDLDIRECT 116.6 06/19/2012 1048    Physical Exam:    VS:  BP 136/70   Pulse 63   Ht '5\' 9"'$  (1.753 m)   Wt 185 lb 9.6 oz (84.2 kg)   SpO2 99%   BMI 27.41 kg/m     Wt Readings from Last 3 Encounters:  10/27/21 185 lb 9.6 oz (84.2 kg)  09/30/14 203 lb (92.1 kg)  09/18/14 203 lb 6.4 oz (92.3 kg)     GEN:  Well nourished, well developed in no acute distress HEENT: Normal NECK: No JVD; No carotid bruits LYMPHATICS: No lymphadenopathy CARDIAC: RRR, no murmurs, rubs, gallops RESPIRATORY:  Clear to auscultation without rales, wheezing or rhonchi  ABDOMEN: Soft, non-tender, non-distended MUSCULOSKELETAL:  No edema; No deformity  SKIN: Warm and dry NEUROLOGIC:  Alert and oriented x 3 PSYCHIATRIC:  Normal affect   ASSESSMENT:    1. Chest pain of uncertain etiology   2. DOE (dyspnea on exertion)   3. Hyperlipidemia, unspecified hyperlipidemia type   4. Essential hypertension   5. Snoring   6. Daytime somnolence    PLAN:    Chest pain/DOE: Atypical in description but does have significant CAD risk factors (age, hypertension, hyperlipidemia, T2DM, family history).  Also having dyspnea on exertion that could represent anginal equivalent.  Recommend coronary CTA to rule out obstructive CAD.  Will give Lopressor 25 mg prior to study.  Check echocardiogram rule out structural heart disease  Hypertension: On lisinopril-HCTZ 20-12.5 mg twice daily and amlodipine 2.5 mg daily.  Appears controlled  Hyperlipidemia: On Zetia 10 mg daily.  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  LDL 78 on 05/11/2020.  We will check lipid panel  T2DM: A1c 7.1% on 10/04/2021.  On Trulicity, metformin, insulin  Snoring/daytime  somnolence: Discussed sleep study, he would like to hold off for now  RTC in 6 months  Medication Adjustments/Labs and Tests Ordered: Current medicines are reviewed at length with the patient today.  Concerns regarding medicines are outlined above.  Orders Placed This Encounter  Procedures   CT CORONARY MORPH W/CTA COR W/SCORE W/CA W/CM &/OR WO/CM   Basic metabolic panel   Lipid panel   EKG 12-Lead   ECHOCARDIOGRAM COMPLETE   Meds ordered this encounter  Medications   metoprolol tartrate (LOPRESSOR) 25 MG tablet    Sig: Take 1 tablet (25 mg) TWO hours prior to CT scan    Dispense:  1 tablet    Refill:  0    Patient Instructions  Medication Instructions:  Your physician recommends that you continue on your current medications as directed. Please refer to the Current Medication list given to you today.  Morning of CT scan: Take metoprolol 25 mg TWO hours prior to scan   *If you need a refill on your cardiac medications before your next appointment, please call your pharmacy*   Lab Work: BMET, Lipid today  If you have labs (blood work) drawn today and your tests are completely normal, you will receive your results only by: [place] (if you have MyChart) OR A paper copy in the mail If you have any lab test that is abnormal or we need to change your treatment, we will call you to review the results.   Testing/Procedures: Coronary CT-see instructions below  Your physician has requested that you have an echocardiogram. Echocardiography is a painless test that uses sound waves to create images of your heart. It provides your doctor with information about the size and shape of your heart and how well your heart's chambers and valves are working. This procedure takes approximately one hour. There are no restrictions for this procedure.  Follow-Up: At [facility], you and your health needs are our priority.  As part of our continuing mission to provide you with  exceptional heart care, we have created designated Provider Care Teams.  These Care Teams include your primary Cardiologist (physician) and Advanced Practice Providers (APPs -  Physician Assistants and Nurse Practitioners) who all work together to provide you with the care you need, when you need it.  We recommend signing up for the patient portal called "MyChart".  Sign up information is provided on this After Visit Summary.  MyChart is used to connect with patients for Virtual Visits (Telemedicine).  Patients are able to view lab/test results, encounter notes, upcoming appointments, etc.  Non-urgent messages can be sent to your provider as well.   To learn more about what you can do with MyChart, go to [email or web address].    Your next appointment:   6 month(s)  The format for your next appointment:   In Person  Provider:   Donato Heinz, MD {   Other Instructions   Your cardiac CT will be scheduled at one of the below locations:   [facility] [address] [place], [place] [address] [phone]  If scheduled at [facility], please arrive at the [facility] and Children's Entrance (Entrance C2) of [facility] 30 minutes prior to test start time. You can use the FREE valet parking offered at entrance C (encouraged to control the heart rate for the test)  Proceed to the [facility] Radiology Department (first floor) to check-in and test prep.  All radiology patients and guests should use entrance C2 at [facility], accessed from [facility], even though the hospital's physical address listed is [address].    Please follow these instructions carefully (unless otherwise directed):  Hold all erectile dysfunction medications at least 3 days (72 hrs) prior to test.  On the Night Before the Test: Be sure to Drink plenty of water. Do not consume any caffeinated/decaffeinated beverages or chocolate 12 hours prior  to your test. Do not take any antihistamines 12 hours prior to your test.  On the Day of the Test: Drink plenty of water until 1 hour prior to the test. Do not eat any food 4 hours  prior to the test. You may take your regular medications prior to the test.  Take metoprolol (Lopressor) two hours prior to test. HOLD lisinopril/Hydrochlorothiazide morning of the test.      After the Test: Drink plenty of water. After receiving IV contrast, you may experience a mild flushed feeling. This is normal. On occasion, you may experience a mild rash up to 24 hours after the test. This is not dangerous. If this occurs, you can take Benadryl 25 mg and increase your fluid intake. If you experience trouble breathing, this can be serious. If it is severe call 911 IMMEDIATELY. If it is mild, please call our office. If you take any of these medications: Glipizide/Metformin, Avandament, Glucavance, please do not take 48 hours after completing test unless otherwise instructed.  We will call to schedule your test 2-4 weeks out understanding that some insurance companies will need an authorization prior to the service being performed.   For non-scheduling related questions, please contact the cardiac imaging nurse navigator should you have any questions/concerns: Marchia Bond, Cardiac Imaging Nurse Navigator Gordy Clement, Cardiac Imaging Nurse Navigator [facility] Direct Office Dial: [phone]   For scheduling needs, including cancellations and rescheduling, please call [place], [phone].         Signed, Donato Heinz, MD  10/27/2021 9:55 AM    [place]

## 2021-10-27 ENCOUNTER — Ambulatory Visit: Payer: Medicare PPO | Admitting: Cardiology

## 2021-10-27 ENCOUNTER — Encounter: Payer: Self-pay | Admitting: Cardiology

## 2021-10-27 VITALS — BP 136/70 | HR 63 | Ht 69.0 in | Wt 185.6 lb

## 2021-10-27 DIAGNOSIS — R079 Chest pain, unspecified: Secondary | ICD-10-CM

## 2021-10-27 DIAGNOSIS — R400 Somnolence: Secondary | ICD-10-CM | POA: Diagnosis not present

## 2021-10-27 DIAGNOSIS — R0609 Other forms of dyspnea: Secondary | ICD-10-CM | POA: Diagnosis not present

## 2021-10-27 DIAGNOSIS — R0683 Snoring: Secondary | ICD-10-CM | POA: Diagnosis not present

## 2021-10-27 DIAGNOSIS — E785 Hyperlipidemia, unspecified: Secondary | ICD-10-CM | POA: Diagnosis not present

## 2021-10-27 DIAGNOSIS — I10 Essential (primary) hypertension: Secondary | ICD-10-CM

## 2021-10-27 LAB — LIPID PANEL
Chol/HDL Ratio: 3.9 ratio (ref 0.0–5.0)
Cholesterol, Total: 154 mg/dL (ref 100–199)
HDL: 40 mg/dL (ref >39)
LDL Chol Calc (NIH): 75 mg/dL (ref 0–99)
Triglycerides: 235 mg/dL — ABNORMAL HIGH (ref 0–149)
VLDL Cholesterol Cal: 39 mg/dL (ref 5–40)

## 2021-10-27 LAB — BASIC METABOLIC PANEL
BUN/Creatinine Ratio: 14 (ref 10–24)
BUN: 16 mg/dL (ref 8–27)
CO2: 23 mmol/L (ref 20–29)
Calcium: 9.3 mg/dL (ref 8.6–10.2)
Chloride: 95 mmol/L — ABNORMAL LOW (ref 96–106)
Creatinine, Ser: 1.17 mg/dL (ref 0.76–1.27)
Glucose: 234 mg/dL — ABNORMAL HIGH (ref 70–99)
Potassium: 5 mmol/L (ref 3.5–5.2)
Sodium: 133 mmol/L — ABNORMAL LOW (ref 134–144)
eGFR: 68 mL/min/{1.73_m2} (ref >59)

## 2021-10-27 MED ORDER — METOPROLOL TARTRATE 25 MG PO TABS: ORAL_TABLET | ORAL | 0 refills | Status: DC

## 2021-10-27 NOTE — Patient Instructions (Signed)
Medication Instructions:  Your physician recommends that you continue on your current medications as directed. Please refer to the Current Medication list given to you today.  Morning of CT scan: Take metoprolol 25 mg TWO hours prior to scan   *If you need a refill on your cardiac medications before your next appointment, please call your pharmacy*   Lab Work: BMET, Lipid today  If you have labs (blood work) drawn today and your tests are completely normal, you will receive your results only by: [place] (if you have MyChart) OR A paper copy in the mail If you have any lab test that is abnormal or we need to change your treatment, we will call you to review the results.   Testing/Procedures: Coronary CT-see instructions below  Your physician has requested that you have an echocardiogram. Echocardiography is a painless test that uses sound waves to create images of your heart. It provides your doctor with information about the size and shape of your heart and how well your heart's chambers and valves are working. This procedure takes approximately one hour. There are no restrictions for this procedure.  Follow-Up: At  [facility], you and your health needs are our priority.  As part of our continuing mission to provide you with exceptional heart care, we have created designated Provider Care Teams.  These Care Teams include your primary Cardiologist (physician) and Advanced Practice Providers (APPs -  Physician Assistants and Nurse Practitioners) who all work together to provide you with the care you need, when you need it.  We recommend signing up for the patient portal called "MyChart".  Sign up information is provided on this After Visit Summary.  MyChart is used to connect with patients for Virtual Visits (Telemedicine).  Patients are able to view lab/test results, encounter notes, upcoming appointments, etc.  Non-urgent messages can be sent to your provider as well.   To learn  more about what you can do with MyChart, go to [email or web address].    Your next appointment:   6 month(s)  The format for your next appointment:   In Person  Provider:   Donato Heinz, MD {   Other Instructions   Your cardiac CT will be scheduled at one of the below locations:   [facility] [address] [place], [place] [address] [phone]  If scheduled at [facility], please arrive at the [facility] and Children's Entrance (Entrance C2) of [facility] 30 minutes prior to test start time. You can use the FREE valet parking offered at entrance C (encouraged to control the heart rate for the test)  Proceed to the [facility] Radiology Department (first floor) to check-in and test prep.  All radiology patients and guests should use entrance C2 at [facility], accessed from [facility], even though the hospital's physical address listed is [address]    Please follow these instructions carefully (unless otherwise directed):  Hold all erectile dysfunction medications at least 3 days (72 hrs) prior to test.  On the Night Before the Test: Be sure to Drink plenty of water. Do not consume any caffeinated/decaffeinated beverages or chocolate 12 hours prior to your test. Do not take any antihistamines 12 hours prior to your test.  On the Day of the Test: Drink plenty of water until 1 hour prior to the test. Do not eat any food 4 hours prior to the test. You may take your regular medications prior to the test.  Take metoprolol (Lopressor) two hours prior to test. HOLD lisinopril/Hydrochlorothiazide morning of the test.      After the Test: Drink plenty of water. After receiving IV contrast, you may experience a mild flushed feeling. This is normal. On occasion, you may experience a mild rash up to 24 hours after the test. This is not dangerous. If this occurs, you can take Benadryl 25 mg and increase  your fluid intake. If you experience trouble breathing, this can be serious. If it is severe call 911 IMMEDIATELY. If it is mild, please call our office. If you take any of these medications: Glipizide/Metformin, Avandament, Glucavance, please do not take 48 hours after completing test unless otherwise instructed.  We will call to schedule your test 2-4 weeks out understanding that some insurance companies will need an authorization prior to the service being performed.   For non-scheduling related questions, please contact the cardiac imaging nurse navigator should you have any questions/concerns: Marchia Bond, Cardiac Imaging Nurse Navigator Gordy Clement, Cardiac Imaging Nurse Navigator [facility] Direct Office Dial: [phone]   For scheduling needs, including cancellations and rescheduling, please call [place], [phone].

## 2021-11-11 ENCOUNTER — Telehealth (HOSPITAL_COMMUNITY): Payer: Self-pay | Admitting: *Deleted

## 2021-11-11 NOTE — Telephone Encounter (Signed)
Reaching out to patient to offer assistance regarding upcoming cardiac imaging study; pt verbalizes understanding of appt date/time, parking situation and where to check in, pre-test NPO status and medications ordered, and verified current allergies; name and call back number provided for further questions should they arise  Gordy Clement RN Navigator Cardiac Imaging [facility] [phone] office [phone] cell  Patient to take '25mg'$  metoprolol tartrate two hours prior to his cardiac CT scan. He is aware to arrive at 12pm.

## 2021-11-12 ENCOUNTER — Ambulatory Visit (HOSPITAL_COMMUNITY)
Admission: RE | Admit: 2021-11-12 | Discharge: 2021-11-12 | Disposition: A | Discharge status: Home / Self Care | Payer: Medicare PPO | Source: Ambulatory Visit | Attending: Cardiology | Admitting: Cardiology

## 2021-11-12 ENCOUNTER — Ambulatory Visit (HOSPITAL_BASED_OUTPATIENT_CLINIC_OR_DEPARTMENT_OTHER)
Admission: RE | Admit: 2021-11-12 | Discharge: 2021-11-12 | Disposition: A | Discharge status: Home / Self Care | Payer: Medicare PPO | Source: Ambulatory Visit | Attending: Cardiology | Admitting: Cardiology

## 2021-11-12 ENCOUNTER — Other Ambulatory Visit: Payer: Self-pay | Admitting: Cardiology

## 2021-11-12 DIAGNOSIS — I2510 Atherosclerotic heart disease of native coronary artery without angina pectoris: Secondary | ICD-10-CM | POA: Insufficient documentation

## 2021-11-12 DIAGNOSIS — R931 Abnormal findings on diagnostic imaging of heart and coronary circulation: Secondary | ICD-10-CM

## 2021-11-12 DIAGNOSIS — R079 Chest pain, unspecified: Secondary | ICD-10-CM | POA: Insufficient documentation

## 2021-11-12 MED ORDER — NITROGLYCERIN 0.4 MG SL SUBL
0.8000 mg | SUBLINGUAL_TABLET | Freq: Once | SUBLINGUAL | Status: AC
  Administered 2021-11-12: 0.8 mg via SUBLINGUAL

## 2021-11-12 MED ORDER — IOHEXOL 350 MG/ML SOLN
100.0000 mL | Freq: Once | INTRAVENOUS | Status: AC | PRN
  Administered 2021-11-12: 100 mL via INTRAVENOUS

## 2021-11-12 MED ORDER — NITROGLYCERIN 0.4 MG SL SUBL
SUBLINGUAL_TABLET | SUBLINGUAL | Status: AC
  Filled 2021-11-12: qty 2

## 2021-11-16 ENCOUNTER — Other Ambulatory Visit: Payer: Self-pay | Admitting: *Deleted

## 2021-11-16 ENCOUNTER — Ambulatory Visit (HOSPITAL_COMMUNITY): Payer: Medicare PPO | Attending: Cardiovascular Disease

## 2021-11-16 DIAGNOSIS — R079 Chest pain, unspecified: Secondary | ICD-10-CM

## 2021-11-16 DIAGNOSIS — E785 Hyperlipidemia, unspecified: Secondary | ICD-10-CM

## 2021-11-16 DIAGNOSIS — Z789 Other specified health status: Secondary | ICD-10-CM

## 2021-11-16 DIAGNOSIS — I2510 Atherosclerotic heart disease of native coronary artery without angina pectoris: Secondary | ICD-10-CM

## 2021-11-16 LAB — ECHOCARDIOGRAM COMPLETE
Area-P 1/2: 3.39 cm2
S' Lateral: 2.2 cm

## 2021-11-16 MED ORDER — ASPIRIN 81 MG PO TBEC: 81.0000 mg | DELAYED_RELEASE_TABLET | Freq: Every day | ORAL | 3 refills | Status: DC

## 2022-01-10 ENCOUNTER — Encounter: Payer: Self-pay | Admitting: Pharmacist Clinician (PhC)/ Clinical Pharmacy Specialist

## 2022-01-10 ENCOUNTER — Ambulatory Visit: Payer: Medicare PPO | Attending: Internal Medicine | Admitting: Pharmacist Clinician (PhC)/ Clinical Pharmacy Specialist

## 2022-01-10 DIAGNOSIS — G720 Drug-induced myopathy: Secondary | ICD-10-CM | POA: Diagnosis not present

## 2022-01-10 DIAGNOSIS — T380X5A Adverse effect of glucocorticoids and synthetic analogues, initial encounter: Secondary | ICD-10-CM | POA: Diagnosis not present

## 2022-01-10 NOTE — Assessment & Plan Note (Signed)
Patient with CAD and LDL > 70 currently only on ezetimibe 10 mg daily.  Reviewed options for lowering LDL cholesterol, including PCSK-9 inhibitors, bempedoic acid and inclisiran.  Discussed mechanisms of action, dosing, side effects and potential decreases in LDL cholesterol.  Also reviewed cost information and potential options for patient assistance.  Answered all patient questions.  Based on this information, patient would prefer to start PCSK9 inhibitor.  Will do PA and once approved, he will do one injection every 14 days.  Will need to repeat lipid labs after 3 months.

## 2022-01-10 NOTE — Patient Instructions (Signed)
Your Results:             Your most recent labs Goal  Total Cholesterol 154 < 200  Triglycerides 235 < 150  HDL (happy/good cholesterol) 40 > 40  LDL (lousy/bad cholesterol 75 < 70   Medication changes:  We will start the process to get Repatha covered by your insurance.  Once the prior authorization is complete, we will send you a MyChart message to let you know.  Take one injection every 14 days.   Lab orders:  We want to repeat labs after 2-3 months.  We will send you a lab order to remind you once we get closer to that time.      Thank you for choosing [facility]

## 2022-01-10 NOTE — Progress Notes (Signed)
01/10/2022 John Beasley. [birth year] [id number]   HPI:  John Beasley. is a 71 y.o. male patient of Dr Gardiner Rhyme, who presents today for a lipid clinic evaluation.  See pertinent past medical history below.  He notes having tried 3 or 4 statins in the past, and always developed back pain after starting.  Has only been using ezetimibe for the past couple of years.    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   Current Medications:  ezetimibe 10 mg qd     Cholesterol Goals: LDL < 70   Intolerant/previously tried:  pravastatin, simvastatin, rosuvastatin - back pain      Family history:  father family history - father had multiple blockages, died 40 at second CABG surgery (first set at 37); mother had strokes, died at 62; brother with DM, obesity, no CAD yet; no children  Diet: home cooked, eat out occasionally, tries to avoid fried foods, stays away from fruits 2/2 diabetes, fresh vegetables  Exercise:  no regular exercise  Labs:  7/23:  TC 154, TG 235, HDL 40, LDL 75       Current Outpatient Medications  Medication Sig Dispense Refill   aspirin EC 81 MG tablet Take 1 tablet (81 mg total) by mouth daily. Swallow whole. 90 tablet 3   Dulaglutide (TRULICITY) 1.5 GQ/6.7YP SOPN Inject 1.5 mg into the skin once a week.     ezetimibe (ZETIA) 10 MG tablet Take 1 tablet (10 mg total) by mouth daily. 30 tablet 5   gabapentin (NEURONTIN) 100 MG capsule Take 100 mg by mouth as needed (neck pressure).     insulin glargine, 1 Unit Dial, (TOUJEO SOLOSTAR) 300 UNIT/ML Solostar Pen 20 Units See admin instructions.     lisinopril-hydrochlorothiazide (PRINZIDE,ZESTORETIC) 20-12.5 MG per tablet Take 2 tablets by mouth daily. 180 tablet 1    metFORMIN (GLUCOPHAGE) 1000 MG tablet Take 1,000 mg by mouth 2 (two) times daily with a meal.     metoprolol tartrate (LOPRESSOR) 25 MG tablet Take 1 tablet (25 mg) TWO hours prior to CT scan 1 tablet 0   Multiple Vitamins-Minerals (MENS MULTIVITAMIN PLUS) TABS Take 1 tablet by mouth daily at 12 noon.     No current facility-administered medications for this visit.    Allergies  Allergen Reactions   Codeine    Pravastatin Sodium    Simvastatin     REACTION: muscle  sore    Past Medical History:  Diagnosis Date   Diabetes mellitus    Diastolic dysfunction    GERD (gastroesophageal reflux disease)    Hyperlipidemia    Hypertension    Renal cyst    Renal insufficiency     There were no vitals taken for this visit.   HYPERLIPIDEMIA Patient with CAD and LDL > 70 currently only on ezetimibe 10 mg daily.  Reviewed options for lowering LDL cholesterol, including PCSK-9 inhibitors, bempedoic acid and inclisiran.  Discussed mechanisms of action, dosing, side effects and potential decreases in LDL cholesterol.  Also reviewed cost information and potential options for patient assistance.  Answered all patient questions.  Based on this information, patient would prefer to start PCSK9 inhibitor.  Will do PA and once approved, he will do one  injection every 14 days.  Will need to repeat lipid labs after 3 months.     Tommy Medal PharmD CPP [facility] [address] [place], [place] [address] [phone]

## 2022-01-11 ENCOUNTER — Telehealth: Payer: Self-pay | Admitting: Pharmacist Clinician (PhC)/ Clinical Pharmacy Specialist

## 2022-01-11 MED ORDER — REPATHA SURECLICK 140 MG/ML ~~LOC~~ SOAJ: 140.0000 mg | SUBCUTANEOUS | 12 refills | Status: DC

## 2022-01-11 NOTE — Telephone Encounter (Signed)
PA approved for Repatha through 12.31.24  Key [id number]  Rx sent to [facility] [address] [facility] [place]

## 2022-02-03 DIAGNOSIS — C44329 Squamous cell carcinoma of skin of other parts of face: Secondary | ICD-10-CM | POA: Diagnosis not present

## 2022-02-03 DIAGNOSIS — D485 Neoplasm of uncertain behavior of skin: Secondary | ICD-10-CM | POA: Diagnosis not present

## 2022-02-03 DIAGNOSIS — L570 Actinic keratosis: Secondary | ICD-10-CM | POA: Diagnosis not present

## 2022-02-11 ENCOUNTER — Other Ambulatory Visit (HOSPITAL_BASED_OUTPATIENT_CLINIC_OR_DEPARTMENT_OTHER): Payer: Self-pay

## 2022-02-11 MED ORDER — COMIRNATY 30 MCG/0.3ML IM SUSY
PREFILLED_SYRINGE | INTRAMUSCULAR | 0 refills | Status: DC
  Filled 2022-02-11: qty 0.3, 1d supply, fill #0

## 2022-02-16 DIAGNOSIS — C44329 Squamous cell carcinoma of skin of other parts of face: Secondary | ICD-10-CM | POA: Diagnosis not present

## 2022-03-08 DIAGNOSIS — M7662 Achilles tendinitis, left leg: Secondary | ICD-10-CM | POA: Diagnosis not present

## 2022-04-07 ENCOUNTER — Other Ambulatory Visit (HOSPITAL_BASED_OUTPATIENT_CLINIC_OR_DEPARTMENT_OTHER): Payer: Self-pay

## 2022-04-07 MED ORDER — FLUAD QUADRIVALENT 0.5 ML IM PRSY
PREFILLED_SYRINGE | INTRAMUSCULAR | 0 refills | Status: DC
  Filled 2022-04-07: qty 0.5, 1d supply, fill #0

## 2022-04-19 DIAGNOSIS — L570 Actinic keratosis: Secondary | ICD-10-CM | POA: Diagnosis not present

## 2022-05-04 DIAGNOSIS — R809 Proteinuria, unspecified: Secondary | ICD-10-CM | POA: Diagnosis not present

## 2022-05-04 DIAGNOSIS — E1165 Type 2 diabetes mellitus with hyperglycemia: Secondary | ICD-10-CM | POA: Diagnosis not present

## 2022-05-04 DIAGNOSIS — E785 Hyperlipidemia, unspecified: Secondary | ICD-10-CM | POA: Diagnosis not present

## 2022-05-11 DIAGNOSIS — Z794 Long term (current) use of insulin: Secondary | ICD-10-CM | POA: Diagnosis not present

## 2022-05-11 DIAGNOSIS — E1165 Type 2 diabetes mellitus with hyperglycemia: Secondary | ICD-10-CM | POA: Diagnosis not present

## 2022-05-11 DIAGNOSIS — E785 Hyperlipidemia, unspecified: Secondary | ICD-10-CM | POA: Diagnosis not present

## 2022-05-11 DIAGNOSIS — I10 Essential (primary) hypertension: Secondary | ICD-10-CM | POA: Diagnosis not present

## 2022-06-03 DIAGNOSIS — I10 Essential (primary) hypertension: Secondary | ICD-10-CM | POA: Diagnosis not present

## 2022-06-03 DIAGNOSIS — E7800 Pure hypercholesterolemia, unspecified: Secondary | ICD-10-CM | POA: Diagnosis not present

## 2022-06-03 DIAGNOSIS — Z0000 Encounter for general adult medical examination without abnormal findings: Secondary | ICD-10-CM | POA: Diagnosis not present

## 2022-06-03 DIAGNOSIS — G720 Drug-induced myopathy: Secondary | ICD-10-CM | POA: Diagnosis not present

## 2022-06-03 DIAGNOSIS — I700 Atherosclerosis of aorta: Secondary | ICD-10-CM | POA: Diagnosis not present

## 2022-06-03 DIAGNOSIS — M542 Cervicalgia: Secondary | ICD-10-CM | POA: Diagnosis not present

## 2022-06-03 DIAGNOSIS — Z23 Encounter for immunization: Secondary | ICD-10-CM | POA: Diagnosis not present

## 2022-06-03 DIAGNOSIS — E1165 Type 2 diabetes mellitus with hyperglycemia: Secondary | ICD-10-CM | POA: Diagnosis not present

## 2022-06-27 DIAGNOSIS — Z08 Encounter for follow-up examination after completed treatment for malignant neoplasm: Secondary | ICD-10-CM | POA: Diagnosis not present

## 2022-06-27 DIAGNOSIS — Z85828 Personal history of other malignant neoplasm of skin: Secondary | ICD-10-CM | POA: Diagnosis not present

## 2022-06-27 DIAGNOSIS — L814 Other melanin hyperpigmentation: Secondary | ICD-10-CM | POA: Diagnosis not present

## 2022-06-27 DIAGNOSIS — D225 Melanocytic nevi of trunk: Secondary | ICD-10-CM | POA: Diagnosis not present

## 2022-06-27 DIAGNOSIS — D485 Neoplasm of uncertain behavior of skin: Secondary | ICD-10-CM | POA: Diagnosis not present

## 2022-06-27 DIAGNOSIS — L570 Actinic keratosis: Secondary | ICD-10-CM | POA: Diagnosis not present

## 2022-06-27 DIAGNOSIS — C4442 Squamous cell carcinoma of skin of scalp and neck: Secondary | ICD-10-CM | POA: Diagnosis not present

## 2022-07-01 DIAGNOSIS — C4442 Squamous cell carcinoma of skin of scalp and neck: Secondary | ICD-10-CM | POA: Diagnosis not present

## 2022-09-01 DIAGNOSIS — W57XXXA Bitten or stung by nonvenomous insect and other nonvenomous arthropods, initial encounter: Secondary | ICD-10-CM | POA: Diagnosis not present

## 2022-09-01 DIAGNOSIS — S70351A Superficial foreign body, right thigh, initial encounter: Secondary | ICD-10-CM | POA: Diagnosis not present

## 2022-09-05 DIAGNOSIS — S80861A Insect bite (nonvenomous), right lower leg, initial encounter: Secondary | ICD-10-CM | POA: Diagnosis not present

## 2022-09-05 DIAGNOSIS — W57XXXA Bitten or stung by nonvenomous insect and other nonvenomous arthropods, initial encounter: Secondary | ICD-10-CM | POA: Diagnosis not present

## 2022-09-13 DIAGNOSIS — M7662 Achilles tendinitis, left leg: Secondary | ICD-10-CM | POA: Diagnosis not present

## 2022-09-13 DIAGNOSIS — M25562 Pain in left knee: Secondary | ICD-10-CM | POA: Diagnosis not present

## 2022-09-29 DIAGNOSIS — M6281 Muscle weakness (generalized): Secondary | ICD-10-CM | POA: Diagnosis not present

## 2022-09-29 DIAGNOSIS — M25672 Stiffness of left ankle, not elsewhere classified: Secondary | ICD-10-CM | POA: Diagnosis not present

## 2022-09-29 DIAGNOSIS — R2689 Other abnormalities of gait and mobility: Secondary | ICD-10-CM | POA: Diagnosis not present

## 2022-09-29 DIAGNOSIS — M25572 Pain in left ankle and joints of left foot: Secondary | ICD-10-CM | POA: Diagnosis not present

## 2022-10-03 DIAGNOSIS — M25572 Pain in left ankle and joints of left foot: Secondary | ICD-10-CM | POA: Diagnosis not present

## 2022-10-03 DIAGNOSIS — R2689 Other abnormalities of gait and mobility: Secondary | ICD-10-CM | POA: Diagnosis not present

## 2022-10-03 DIAGNOSIS — M25672 Stiffness of left ankle, not elsewhere classified: Secondary | ICD-10-CM | POA: Diagnosis not present

## 2022-10-03 DIAGNOSIS — M6281 Muscle weakness (generalized): Secondary | ICD-10-CM | POA: Diagnosis not present

## 2022-10-05 DIAGNOSIS — M25572 Pain in left ankle and joints of left foot: Secondary | ICD-10-CM | POA: Diagnosis not present

## 2022-10-05 DIAGNOSIS — M6281 Muscle weakness (generalized): Secondary | ICD-10-CM | POA: Diagnosis not present

## 2022-10-05 DIAGNOSIS — R2689 Other abnormalities of gait and mobility: Secondary | ICD-10-CM | POA: Diagnosis not present

## 2022-10-05 DIAGNOSIS — M25672 Stiffness of left ankle, not elsewhere classified: Secondary | ICD-10-CM | POA: Diagnosis not present

## 2022-10-10 DIAGNOSIS — L570 Actinic keratosis: Secondary | ICD-10-CM | POA: Diagnosis not present

## 2022-10-10 DIAGNOSIS — C44329 Squamous cell carcinoma of skin of other parts of face: Secondary | ICD-10-CM | POA: Diagnosis not present

## 2022-10-10 DIAGNOSIS — L814 Other melanin hyperpigmentation: Secondary | ICD-10-CM | POA: Diagnosis not present

## 2022-10-10 DIAGNOSIS — D485 Neoplasm of uncertain behavior of skin: Secondary | ICD-10-CM | POA: Diagnosis not present

## 2022-10-10 DIAGNOSIS — L821 Other seborrheic keratosis: Secondary | ICD-10-CM | POA: Diagnosis not present

## 2022-10-11 DIAGNOSIS — M25672 Stiffness of left ankle, not elsewhere classified: Secondary | ICD-10-CM | POA: Diagnosis not present

## 2022-10-11 DIAGNOSIS — M6281 Muscle weakness (generalized): Secondary | ICD-10-CM | POA: Diagnosis not present

## 2022-10-11 DIAGNOSIS — R2689 Other abnormalities of gait and mobility: Secondary | ICD-10-CM | POA: Diagnosis not present

## 2022-10-11 DIAGNOSIS — M25572 Pain in left ankle and joints of left foot: Secondary | ICD-10-CM | POA: Diagnosis not present

## 2022-10-13 DIAGNOSIS — M25672 Stiffness of left ankle, not elsewhere classified: Secondary | ICD-10-CM | POA: Diagnosis not present

## 2022-10-13 DIAGNOSIS — M6281 Muscle weakness (generalized): Secondary | ICD-10-CM | POA: Diagnosis not present

## 2022-10-13 DIAGNOSIS — M25572 Pain in left ankle and joints of left foot: Secondary | ICD-10-CM | POA: Diagnosis not present

## 2022-10-13 DIAGNOSIS — R2689 Other abnormalities of gait and mobility: Secondary | ICD-10-CM | POA: Diagnosis not present

## 2022-10-24 DIAGNOSIS — C44329 Squamous cell carcinoma of skin of other parts of face: Secondary | ICD-10-CM | POA: Diagnosis not present

## 2022-10-25 DIAGNOSIS — M6281 Muscle weakness (generalized): Secondary | ICD-10-CM | POA: Diagnosis not present

## 2022-10-25 DIAGNOSIS — M25672 Stiffness of left ankle, not elsewhere classified: Secondary | ICD-10-CM | POA: Diagnosis not present

## 2022-10-25 DIAGNOSIS — R2689 Other abnormalities of gait and mobility: Secondary | ICD-10-CM | POA: Diagnosis not present

## 2022-10-25 DIAGNOSIS — M25572 Pain in left ankle and joints of left foot: Secondary | ICD-10-CM | POA: Diagnosis not present

## 2022-10-27 DIAGNOSIS — M25672 Stiffness of left ankle, not elsewhere classified: Secondary | ICD-10-CM | POA: Diagnosis not present

## 2022-10-27 DIAGNOSIS — M6281 Muscle weakness (generalized): Secondary | ICD-10-CM | POA: Diagnosis not present

## 2022-10-27 DIAGNOSIS — R2689 Other abnormalities of gait and mobility: Secondary | ICD-10-CM | POA: Diagnosis not present

## 2022-10-27 DIAGNOSIS — M25572 Pain in left ankle and joints of left foot: Secondary | ICD-10-CM | POA: Diagnosis not present

## 2022-11-01 DIAGNOSIS — M6281 Muscle weakness (generalized): Secondary | ICD-10-CM | POA: Diagnosis not present

## 2022-11-01 DIAGNOSIS — M25572 Pain in left ankle and joints of left foot: Secondary | ICD-10-CM | POA: Diagnosis not present

## 2022-11-01 DIAGNOSIS — R2689 Other abnormalities of gait and mobility: Secondary | ICD-10-CM | POA: Diagnosis not present

## 2022-11-01 DIAGNOSIS — M25672 Stiffness of left ankle, not elsewhere classified: Secondary | ICD-10-CM | POA: Diagnosis not present

## 2022-11-03 DIAGNOSIS — M25572 Pain in left ankle and joints of left foot: Secondary | ICD-10-CM | POA: Diagnosis not present

## 2022-11-03 DIAGNOSIS — R2689 Other abnormalities of gait and mobility: Secondary | ICD-10-CM | POA: Diagnosis not present

## 2022-11-03 DIAGNOSIS — M6281 Muscle weakness (generalized): Secondary | ICD-10-CM | POA: Diagnosis not present

## 2022-11-03 DIAGNOSIS — M25672 Stiffness of left ankle, not elsewhere classified: Secondary | ICD-10-CM | POA: Diagnosis not present

## 2022-11-07 DIAGNOSIS — M25672 Stiffness of left ankle, not elsewhere classified: Secondary | ICD-10-CM | POA: Diagnosis not present

## 2022-11-07 DIAGNOSIS — M6281 Muscle weakness (generalized): Secondary | ICD-10-CM | POA: Diagnosis not present

## 2022-11-07 DIAGNOSIS — M25572 Pain in left ankle and joints of left foot: Secondary | ICD-10-CM | POA: Diagnosis not present

## 2022-11-07 DIAGNOSIS — R2689 Other abnormalities of gait and mobility: Secondary | ICD-10-CM | POA: Diagnosis not present

## 2022-11-08 DIAGNOSIS — R2689 Other abnormalities of gait and mobility: Secondary | ICD-10-CM | POA: Diagnosis not present

## 2022-11-08 DIAGNOSIS — M25672 Stiffness of left ankle, not elsewhere classified: Secondary | ICD-10-CM | POA: Diagnosis not present

## 2022-11-08 DIAGNOSIS — M6281 Muscle weakness (generalized): Secondary | ICD-10-CM | POA: Diagnosis not present

## 2022-11-08 DIAGNOSIS — M25572 Pain in left ankle and joints of left foot: Secondary | ICD-10-CM | POA: Diagnosis not present

## 2022-11-09 DIAGNOSIS — M25572 Pain in left ankle and joints of left foot: Secondary | ICD-10-CM | POA: Diagnosis not present

## 2022-11-09 DIAGNOSIS — R2689 Other abnormalities of gait and mobility: Secondary | ICD-10-CM | POA: Diagnosis not present

## 2022-11-09 DIAGNOSIS — Z794 Long term (current) use of insulin: Secondary | ICD-10-CM | POA: Diagnosis not present

## 2022-11-09 DIAGNOSIS — M6281 Muscle weakness (generalized): Secondary | ICD-10-CM | POA: Diagnosis not present

## 2022-11-09 DIAGNOSIS — I10 Essential (primary) hypertension: Secondary | ICD-10-CM | POA: Diagnosis not present

## 2022-11-09 DIAGNOSIS — M25672 Stiffness of left ankle, not elsewhere classified: Secondary | ICD-10-CM | POA: Diagnosis not present

## 2022-11-09 DIAGNOSIS — E785 Hyperlipidemia, unspecified: Secondary | ICD-10-CM | POA: Diagnosis not present

## 2022-11-09 DIAGNOSIS — E1165 Type 2 diabetes mellitus with hyperglycemia: Secondary | ICD-10-CM | POA: Diagnosis not present

## 2022-12-08 DIAGNOSIS — M79661 Pain in right lower leg: Secondary | ICD-10-CM | POA: Diagnosis not present

## 2022-12-08 DIAGNOSIS — I10 Essential (primary) hypertension: Secondary | ICD-10-CM | POA: Diagnosis not present

## 2022-12-08 DIAGNOSIS — E119 Type 2 diabetes mellitus without complications: Secondary | ICD-10-CM | POA: Diagnosis not present

## 2022-12-08 DIAGNOSIS — M542 Cervicalgia: Secondary | ICD-10-CM | POA: Diagnosis not present

## 2022-12-08 DIAGNOSIS — E7800 Pure hypercholesterolemia, unspecified: Secondary | ICD-10-CM | POA: Diagnosis not present

## 2022-12-08 DIAGNOSIS — G720 Drug-induced myopathy: Secondary | ICD-10-CM | POA: Diagnosis not present

## 2022-12-13 ENCOUNTER — Other Ambulatory Visit (HOSPITAL_BASED_OUTPATIENT_CLINIC_OR_DEPARTMENT_OTHER): Payer: Self-pay

## 2022-12-13 MED ORDER — COVID-19 MRNA VAC-TRIS(PFIZER) 30 MCG/0.3ML IM SUSY
0.3000 mL | PREFILLED_SYRINGE | Freq: Once | INTRAMUSCULAR | 0 refills | Status: AC
  Filled 2022-12-13: qty 0.3, 1d supply, fill #0

## 2022-12-13 MED ORDER — INFLUENZA VAC A&B SURF ANT ADJ 0.5 ML IM SUSY
0.5000 mL | PREFILLED_SYRINGE | Freq: Once | INTRAMUSCULAR | 0 refills | Status: AC
  Filled 2022-12-13: qty 0.5, 1d supply, fill #0

## 2023-05-01 DIAGNOSIS — E1165 Type 2 diabetes mellitus with hyperglycemia: Secondary | ICD-10-CM | POA: Diagnosis not present

## 2023-05-01 DIAGNOSIS — E785 Hyperlipidemia, unspecified: Secondary | ICD-10-CM | POA: Diagnosis not present

## 2023-05-05 ENCOUNTER — Encounter: Payer: Self-pay | Admitting: Family Medicine

## 2023-05-08 ENCOUNTER — Encounter: Payer: Self-pay | Admitting: Family Medicine

## 2023-05-08 ENCOUNTER — Ambulatory Visit: Payer: Medicare PPO | Admitting: Family Medicine

## 2023-05-08 VITALS — BP 150/78 | HR 71 | Temp 98.2°F | Ht 69.0 in | Wt 190.6 lb

## 2023-05-08 DIAGNOSIS — E782 Mixed hyperlipidemia: Secondary | ICD-10-CM | POA: Diagnosis not present

## 2023-05-08 DIAGNOSIS — Z794 Long term (current) use of insulin: Secondary | ICD-10-CM

## 2023-05-08 DIAGNOSIS — I10 Essential (primary) hypertension: Secondary | ICD-10-CM

## 2023-05-08 DIAGNOSIS — E785 Hyperlipidemia, unspecified: Secondary | ICD-10-CM | POA: Diagnosis not present

## 2023-05-08 DIAGNOSIS — E119 Type 2 diabetes mellitus without complications: Secondary | ICD-10-CM | POA: Diagnosis not present

## 2023-05-08 DIAGNOSIS — Z7984 Long term (current) use of oral hypoglycemic drugs: Secondary | ICD-10-CM | POA: Diagnosis not present

## 2023-05-08 DIAGNOSIS — Z7985 Long-term (current) use of injectable non-insulin antidiabetic drugs: Secondary | ICD-10-CM | POA: Diagnosis not present

## 2023-05-08 DIAGNOSIS — E1165 Type 2 diabetes mellitus with hyperglycemia: Secondary | ICD-10-CM | POA: Diagnosis not present

## 2023-05-08 DIAGNOSIS — M7662 Achilles tendinitis, left leg: Secondary | ICD-10-CM | POA: Insufficient documentation

## 2023-05-08 DIAGNOSIS — M1612 Unilateral primary osteoarthritis, left hip: Secondary | ICD-10-CM | POA: Insufficient documentation

## 2023-05-08 LAB — PROTEIN / CREATININE RATIO, URINE
Albumin, U: 10
Creatinine, Urine: 104.1
EGFR: 71

## 2023-05-08 LAB — LIPID PANEL
Cholesterol: 156 (ref 0–200)
HDL: 39 (ref 35–70)
LDL Cholesterol: 75
Triglycerides: 254 — AB (ref 40–160)

## 2023-05-08 NOTE — Assessment & Plan Note (Signed)
Continue ezetimibe 10 mg daily. Consider starting Repatha.

## 2023-05-08 NOTE — Progress Notes (Signed)
[facility] [address] [place] [address] Dept: [phone] Dept Fax: [phone]  New Patient Office Visit  Subjective:    Patient ID: John Beasley., male    DOB: July 06, 1950, 73 y.o..   MRN: [id number]  Chief Complaint  Patient presents with   Establish Care    NP -establish care. C/o elevated BP.  ? Repatha?   History of Present Illness:  Patient is in today to establish care. Mr. Winningham was born in [place], [place]. His father served in [employer], so the family moved around quite a bit, including overseas. Mr. Lobdell moved to [place] in 1987. He attended [employer] receiving a BS in manufacturing systems. Latter, he returned for his master's in Financial risk analyst education. 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 Mr. Dorner has been married for 44 years. He has no children. He denies use of tobacco or drugs and only rarely drinks alcohol.  Mr. Riegler has hypertension. He is managed on amlodipine 5 mg daily and lisinopril-HCTZ (Zestoretic) 20-12.5 mg daily.  Mr. Leichter has a strong family history of ASCVD. He has hyperlipidemia. He has been intolerant of statins. he is currently prescribed ezetimibe 10 mg daily and Repatha. He notes he has not actually started the Repatha.  Mr. Raczkowski has Type 2 diabetes. He is managed on dulaglutide (Trulicity) 1.5 mg weekly, metformin 500 mg two tabs bid, and insulin glargine (Toujeo) 30 units daily.  Mr. Sisneros has intermittent neck pain due to an old injury. He takes gabapentin intermittently for this.   Past Medical History: Patient Active Problem List   Diagnosis Date Noted   Left Achilles tendinitis 05/08/2023   Osteoarthritis of left hip 05/08/2023   Cervical radiculopathy 12/10/2013   NAFL (nonalcoholic fatty liver) 11/22/2006   Hyperhidrosis 11/22/2006   Type 2 diabetes mellitus (HCC) 08/29/2006   Hyperlipidemia 08/29/2006   Essential  hypertension 08/29/2006   Acquired cyst of kidney 08/29/2006   Past Surgical History:  Procedure Laterality Date   CHOLECYSTECTOMY N/A 09/30/2014   Procedure: LAPAROSCOPIC CHOLECYSTECTOMY WITH INTRAOPERATIVE CHOLANGIOGRAM;  Surgeon: Almond Lint, MD;  Location: [facility] OR;  Service: General;  Laterality: N/A;   IR RADIOLOGIST EVAL & MGMT  06/04/2019   KIDNEY CYST REMOVAL  2007   [place]   Family History  Problem Relation Age of Onset   Stroke Mother    Diabetes Father    Heart disease Father    Peripheral Artery Disease Father    Diabetes Brother    Peripheral Artery Disease Paternal Uncle    AAA (abdominal aortic aneurysm) Maternal Grandfather    Peripheral Artery Disease Paternal Grandfather    Diabetes Other    Hyperlipidemia Other    Hypertension Other    Stroke Other    Skin cancer Other    Outpatient Medications Prior to Visit  Medication Sig Dispense Refill   amLODipine (NORVASC) 5 MG tablet Take 5 mg by mouth daily.     BD PEN NEEDLE NANO 2ND GEN 32G X 4 MM MISC      Dulaglutide (TRULICITY) 1.5 MG/0.5ML SOPN Inject 1.5 mg into the skin once a week.     ezetimibe (ZETIA) 10 MG tablet Take 1 tablet (10 mg total) by mouth daily. 30 tablet 5   gabapentin (NEURONTIN) 100 MG capsule Take 100 mg by mouth as needed (neck pressure).     insulin glargine, 1 Unit Dial, (TOUJEO SOLOSTAR) 300 UNIT/ML Solostar Pen  Inject 30 Units into the skin See admin instructions.     lisinopril-hydrochlorothiazide (PRINZIDE,ZESTORETIC) 20-12.5 MG per tablet Take 2 tablets by mouth daily. 180 tablet 1   metFORMIN (GLUCOPHAGE) 1000 MG tablet Take 1,000 mg by mouth 2 (two) times daily with a meal.     Multiple Vitamins-Minerals (MENS MULTIVITAMIN PLUS) TABS Take 1 tablet by mouth daily at 12 noon.     nitroGLYCERIN (NITRODUR - DOSED IN MG/24 HR) 0.4 mg/hr patch 1/4 patch Transdermal Once a day for 30 day(s)     tiZANidine (ZANAFLEX) 2 MG tablet Take 2 mg by mouth every 8 (eight) hours as needed.      Evolocumab (REPATHA SURECLICK) 140 MG/ML SOAJ Inject 140 mg into the skin every 14 (fourteen) days. (Patient not taking: Reported on 05/08/2023) 2 mL 12   aspirin EC 81 MG tablet Take 1 tablet (81 mg total) by mouth daily. Swallow whole. 90 tablet 3   COVID-19 mRNA vaccine 2023-2024 (COMIRNATY) syringe Inject into the muscle. 0.3 mL 0   influenza vaccine adjuvanted (FLUAD QUADRIVALENT) 0.5 ML injection Inject into the muscle. 0.5 mL 0   metoprolol tartrate (LOPRESSOR) 25 MG tablet Take 1 tablet (25 mg) TWO hours prior to CT scan 1 tablet 0   No facility-administered medications prior to visit.   Allergies  Allergen Reactions   Codeine    Pravastatin Sodium    Simvastatin     REACTION: muscle  sore   Objective:   Today's Vitals   05/08/23 1340  BP: (!) 150/78  Pulse: 71  Temp: 98.2 F (36.8 C)  TempSrc: Temporal  SpO2: 100%  Weight: 190 lb 9.6 oz (86.5 kg)  Height: 5\' 9"  (1.753 m)   Body mass index is 28.15 kg/m.   General: Well developed, well nourished. No acute distress. Feet- Skin intact. No sign of maceration between toes. Nails are normal. Dorsalis pedis and posterior   tibial artery pulses are normal. 5.07 monofilament testing normal. Psych: Alert and oriented. Normal mood and affect.  Health Maintenance Due  Topic Date Due   Hepatitis C Screening  Never done   Colonoscopy  Never done   Diabetic kidney evaluation - Urine ACR  02/05/2014   OPHTHALMOLOGY EXAM  07/20/2014   HEMOGLOBIN A1C  03/20/2015   Zoster Vaccines- Shingrix (2 of 2) 05/03/2016   Diabetic kidney evaluation - eGFR measurement  10/28/2022   Medicare Annual Wellness (AWV)  06/03/2023   Lab Results (05/08/2023)  Lipid Panel  Total Cholesterol: 156 mg/dL  Triglycerides:  045 mg/dL  HDL Cholesterol: 39 mg/dL  LDL Cholesterol: 75 mg/dl  Hemoglobin W0J: 7.2 %  Comprehensive Metabolic Panel  Sodium: 135 mEq/L  Potassium: 5.6 mEq/L  Chloride: 96 mEq/L  CO2:  24 mmol/L  Glucose: 131  mg/dL  BUN:  13 mg/dL  Creatinine: 8.11 mg/dL  eGFR:  71 BJ/YNW/2.95A2    Assessment & Plan:   Problem List Items Addressed This Visit       Cardiovascular and Mediastinum   Essential hypertension   BP is elevated today. We will monitor this for now. Continue amlodipine 5 mg daily and lisinopril-HCTZ (Zestoretic) 20-12.5 mg daily.      Relevant Medications   amLODipine (NORVASC) 5 MG tablet   nitroGLYCERIN (NITRODUR - DOSED IN MG/24 HR) 0.4 mg/hr patch     Endocrine   Type 2 diabetes mellitus (HCC) - Primary   A1c is just above goal. Follow with endocrinology. Continue dulaglutide (Trulicity) 1.5 mg weekly, metformin 500 mg two tabs  bid, and insulin glargine (Toujeo) 30 units daily. Reminded of the importance ot have annual eye exams.        Other   Hyperlipidemia   Continue ezetimibe 10 mg daily. Consider starting Repatha.      Relevant Medications   amLODipine (NORVASC) 5 MG tablet   nitroGLYCERIN (NITRODUR - DOSED IN MG/24 HR) 0.4 mg/hr patch    Return in about 3 months (around 08/05/2023) for Reassessment.   Loyola Mast, MD

## 2023-05-08 NOTE — Assessment & Plan Note (Signed)
BP is elevated today. We will monitor this for now. Continue amlodipine 5 mg daily and lisinopril-HCTZ (Zestoretic) 20-12.5 mg daily.

## 2023-05-08 NOTE — Assessment & Plan Note (Signed)
A1c is just above goal. Follow with endocrinology. Continue dulaglutide (Trulicity) 1.5 mg weekly, metformin 500 mg two tabs bid, and insulin glargine (Toujeo) 30 units daily. Reminded of the importance ot have annual eye exams.

## 2023-05-09 ENCOUNTER — Encounter: Payer: Self-pay | Admitting: Family Medicine

## 2023-05-09 DIAGNOSIS — L821 Other seborrheic keratosis: Secondary | ICD-10-CM | POA: Diagnosis not present

## 2023-05-09 DIAGNOSIS — Z86007 Personal history of in-situ neoplasm of skin: Secondary | ICD-10-CM | POA: Diagnosis not present

## 2023-05-09 DIAGNOSIS — Z85828 Personal history of other malignant neoplasm of skin: Secondary | ICD-10-CM | POA: Diagnosis not present

## 2023-05-09 DIAGNOSIS — L570 Actinic keratosis: Secondary | ICD-10-CM | POA: Diagnosis not present

## 2023-05-09 DIAGNOSIS — L244 Irritant contact dermatitis due to drugs in contact with skin: Secondary | ICD-10-CM | POA: Diagnosis not present

## 2023-05-09 DIAGNOSIS — L814 Other melanin hyperpigmentation: Secondary | ICD-10-CM | POA: Diagnosis not present

## 2023-05-09 DIAGNOSIS — Z08 Encounter for follow-up examination after completed treatment for malignant neoplasm: Secondary | ICD-10-CM | POA: Diagnosis not present

## 2023-07-13 ENCOUNTER — Telehealth: Payer: Self-pay

## 2023-07-13 NOTE — Telephone Encounter (Signed)
 Copied from CRM [id number]. Topic: Medical Record Request - Other >> Jul 13, 2023 10:14 AM Cammy Copa D wrote: Reason for CRM: Pt called because he had his previous provider send over his medical records to Dr. Vickii Penna office. Pt would like to confirm that those records have been received. Said that office could leave him a message to confirm.

## 2023-07-13 NOTE — Telephone Encounter (Signed)
 Left a detailed message that the records had been received. Dm/cma

## 2023-07-17 ENCOUNTER — Ambulatory Visit (INDEPENDENT_AMBULATORY_CARE_PROVIDER_SITE_OTHER)

## 2023-07-17 DIAGNOSIS — Z0000 Encounter for general adult medical examination without abnormal findings: Secondary | ICD-10-CM

## 2023-07-17 DIAGNOSIS — Z1211 Encounter for screening for malignant neoplasm of colon: Secondary | ICD-10-CM | POA: Diagnosis not present

## 2023-07-17 NOTE — Progress Notes (Signed)
 Subjective:   John Beasley. is a 73 y.o. who presents for a Medicare Wellness preventive visit.  Visit Complete: Virtual I connected with  [name]. on 07/17/23 by a audio enabled telemedicine application and verified that I am speaking with the correct person using two identifiers.  Patient Location: Home  Provider Location: Office/Clinic  I discussed the limitations of evaluation and management by telemedicine. The patient expressed understanding and agreed to proceed.  Vital Signs: Because this visit was a virtual/telehealth visit, some criteria may be missing or patient reported. Any vitals not documented were not able to be obtained and vitals that have been documented are patient reported.  VideoError- Librarian, academic were attempted between this provider and patient, however failed, due to patient having technical difficulties OR patient did not have access to video capability.  We continued and completed visit with audio only.   Persons Participating in Visit: Patient.  AWV Questionnaire: Yes: Patient Medicare AWV questionnaire was completed by the patient on 07/17/2023; I have confirmed that all information answered by patient is correct and no changes since this date.  Cardiac Risk Factors include: advanced age (>63men, >25 women);diabetes mellitus;dyslipidemia;hypertension;male gender     Objective:    Today's Vitals   There is no height or weight on file to calculate BMI.     07/17/2023    3:03 PM 10/04/2014    5:04 PM 09/18/2014   12:22 PM  Advanced Directives  Does Patient Have a Medical Advance Directive? Yes Yes Yes  Type of Estate agent of [place];Living will Healthcare Power of [place];Living will Healthcare Power of [place];Living will  Copy of Healthcare Power of Attorney in Chart? No - copy requested      Current Medications (verified) Outpatient Encounter Medications as of 07/17/2023   Medication Sig   amLODipine (NORVASC) 5 MG tablet Take 5 mg by mouth daily.   BD PEN NEEDLE NANO 2ND GEN 32G X 4 MM MISC    Dulaglutide (TRULICITY) 1.5 MG/0.5ML SOPN Inject 1.5 mg into the skin once a week.   Evolocumab (REPATHA SURECLICK) 140 MG/ML SOAJ Inject 140 mg into the skin every 14 (fourteen) days.   ezetimibe (ZETIA) 10 MG tablet Take 1 tablet (10 mg total) by mouth daily.   fluorouracil (EFUDEX) 5 % cream Apply topically 2 (two) times daily.   gabapentin (NEURONTIN) 100 MG capsule Take 100 mg by mouth as needed (neck pressure).   insulin glargine, 1 Unit Dial, (TOUJEO SOLOSTAR) 300 UNIT/ML Solostar Pen Inject 30 Units into the skin See admin instructions.   lisinopril-hydrochlorothiazide (PRINZIDE,ZESTORETIC) 20-12.5 MG per tablet Take 2 tablets by mouth daily.   metFORMIN (GLUCOPHAGE) 1000 MG tablet Take 1,000 mg by mouth 2 (two) times daily with a meal.   Multiple Vitamins-Minerals (MENS MULTIVITAMIN PLUS) TABS Take 1 tablet by mouth daily at 12 noon.   nitroGLYCERIN (NITRODUR - DOSED IN MG/24 HR) 0.4 mg/hr patch 1/4 patch Transdermal Once a day for 30 day(s)   tiZANidine (ZANAFLEX) 2 MG tablet Take 2 mg by mouth every 8 (eight) hours as needed.   No facility-administered encounter medications on file as of 07/17/2023.    Allergies (verified) Codeine, Pravastatin sodium, and Simvastatin   History: Past Medical History:  Diagnosis Date   Diabetes mellitus    Diastolic dysfunction    GERD (gastroesophageal reflux disease)    Hyperlipidemia    Hypertension    Renal cyst    Renal insufficiency  Past Surgical History:  Procedure Laterality Date   CHOLECYSTECTOMY N/A 09/30/2014   Procedure: LAPAROSCOPIC CHOLECYSTECTOMY WITH INTRAOPERATIVE CHOLANGIOGRAM;  Surgeon: Lockie Rima, MD;  Location: [facility];  Service: General;  Laterality: N/A;   IR RADIOLOGIST EVAL & MGMT  06/04/2019   KIDNEY CYST REMOVAL  2007   [place]   Family History  Problem Relation Age of Onset   Stroke  Mother    Diabetes Father    Heart disease Father    Peripheral Artery Disease Father    Early death Father    Diabetes Brother    Peripheral Artery Disease Paternal Uncle    AAA (abdominal aortic aneurysm) Maternal Grandfather    Peripheral Artery Disease Paternal Grandfather    Diabetes Other    Hyperlipidemia Other    Hypertension Other    Stroke Other    Skin cancer Other    Social History   Socioeconomic History   Marital status: Married    Spouse name: Not on file   Number of children: 0   Years of education: Not on file   Highest education level: Master's degree (e.g., MA, MS, MEng, MEd, MSW, MBA)  Occupational History   Occupation: Retired  Tobacco Use   Smoking status: Never   Smokeless tobacco: Never  Vaping Use   Vaping status: Never Used  Substance and Sexual Activity   Alcohol use: Not Currently    Comment: not normally, holiday events only   Drug use: No   Sexual activity: Yes  Other Topics Concern   Not on file  Social History Narrative   Not on file   Social Drivers of Health   Financial Resource Strain: Low Risk  (07/17/2023)   Overall Financial Resource Strain (CARDIA)    Difficulty of Paying Living Expenses: Not hard at all  Food Insecurity: No Food Insecurity (07/17/2023)   Hunger Vital Sign    Worried About Running Out of Food in the Last Year: Never true    Ran Out of Food in the Last Year: Never true  Transportation Needs: No Transportation Needs (07/17/2023)   PRAPARE - Administrator, Civil Service (Medical): No    Lack of Transportation (Non-Medical): No  Physical Activity: Sufficiently Active (07/17/2023)   Exercise Vital Sign    Days of Exercise per Week: 5 days    Minutes of Exercise per Session: 40 min  Recent Concern: Physical Activity - Insufficiently Active (05/04/2023)   Exercise Vital Sign    Days of Exercise per Week: 2 days    Minutes of Exercise per Session: 20 min  Stress: No Stress Concern Present (07/17/2023)    [employer] of Occupational Health - Occupational Stress Questionnaire    Feeling of Stress : Not at all  Social Connections: Unknown (07/17/2023)   Social Connection and Isolation Panel [NHANES]    Frequency of Communication with Friends and Family: Once a week    Frequency of Social Gatherings with Friends and Family: Patient declined    Attends Religious Services: More than 4 times per year    Active Member of [employer] or Organizations: No    Attends Engineer, structural: Never    Marital Status: Married    Tobacco Counseling Counseling given: Not Answered    Clinical Intake:  Pre-visit preparation completed: Yes  Pain : No/denies pain     Nutritional Risks: None Diabetes: Yes CBG done?: No Did pt. bring in CBG monitor from home?: No  Lab Results  Component Value Date  HGBA1C 9.1 (H) 09/18/2014   HGBA1C 7.0 (H) 02/05/2013   HGBA1C 7.2 (H) 06/19/2012     How often do you need to have someone help you when you read instructions, pamphlets, or other written materials from your doctor or pharmacy?: 1 - Never  Interpreter Needed?: No  Information entered by :: NAllen LPN   Activities of Daily Living     07/17/2023    9:31 AM  In your present state of health, do you have any difficulty performing the following activities:  Hearing? 0  Vision? 0  Difficulty concentrating or making decisions? 0  Walking or climbing stairs? 0  Dressing or bathing? 0  Doing errands, shopping? 0  Preparing Food and eating ? N  Using the Toilet? N  In the past six months, have you accidently leaked urine? N  Do you have problems with loss of bowel control? N  Managing your Medications? N  Managing your Finances? N  Housekeeping or managing your Housekeeping? N    Patient Care Team: Loyola Mast, MD as PCP - General (Family Medicine) Mariea Stable, NP as Nurse Practitioner (Endocrinology) Little Ishikawa, MD as Consulting Physician  (Cardiology)  Indicate any recent Medical Services you may have received from other than [facility] providers in the past year (date may be approximate).     Assessment:   This is a routine wellness examination for Mihail.  Hearing/Vision screen Hearing Screening - Comments:: Denies hearing issues Vision Screening - Comments:: Regular eye exams, [facility]   Goals Addressed             This Visit's Progress    Patient Stated       07/17/2023, keep blood sugar down       Depression Screen     07/17/2023    3:05 PM 05/08/2023    1:53 PM  PHQ 2/9 Scores  PHQ - 2 Score 0 0    Fall Risk     07/17/2023    9:31 AM 05/08/2023    1:53 PM  Fall Risk   Falls in the past year? 0 0  Number falls in past yr: 0 0  Injury with Fall? 0 0  Risk for fall due to : Medication side effect No Fall Risks  Follow up Falls prevention discussed;Falls evaluation completed Falls evaluation completed    MEDICARE RISK AT HOME:  Medicare Risk at Home Any stairs in or around the home?: (Patient-Rptd) Yes If so, are there any without handrails?: (Patient-Rptd) No Home free of loose throw rugs in walkways, pet beds, electrical cords, etc?: (Patient-Rptd) Yes Adequate lighting in your home to reduce risk of falls?: (Patient-Rptd) Yes Life alert?: (Patient-Rptd) No Use of a cane, walker or w/c?: (Patient-Rptd) No Grab bars in the bathroom?: (Patient-Rptd) No Shower chair or bench in shower?: (Patient-Rptd) No Elevated toilet seat or a handicapped toilet?: (Patient-Rptd) No  TIMED UP AND GO:  Was the test performed?  No  Cognitive Function: 6CIT completed        07/17/2023    3:05 PM  6CIT Screen  What Year? 0 points  What month? 0 points  What time? 0 points  Count back from 20 0 points  Months in reverse 0 points  Repeat phrase 0 points  Total Score 0 points    Immunizations Immunization History  Administered Date(s) Administered   Fluad Quad(high Dose 65+) 04/07/2022   Fluad  Trivalent(High Dose 65+) 12/13/2022   PFIZER(Purple Top)SARS-COV-2 Vaccination 05/12/2019, 06/06/2019, 09/21/2020  PNEUMOCOCCAL CONJUGATE-20 06/03/2022   Pfizer(Comirnaty)Fall Seasonal Vaccine 12 years and older 02/11/2022, 12/13/2022   Td 10/28/2008   Tdap 12/29/2018   Zoster Recombinant(Shingrix) 03/08/2016    Screening Tests Health Maintenance  Topic Date Due   Hepatitis C Screening  Never done   Colonoscopy  Never done   Diabetic kidney evaluation - Urine ACR  02/05/2014   OPHTHALMOLOGY EXAM  07/20/2014   HEMOGLOBIN A1C  03/20/2015   Zoster Vaccines- Shingrix (2 of 2) 05/03/2016   COVID-19 Vaccine (6 - 2024-25 season) 06/12/2023   INFLUENZA VACCINE  11/03/2023   Diabetic kidney evaluation - eGFR measurement  05/07/2024   FOOT EXAM  05/07/2024   Medicare Annual Wellness (AWV)  07/16/2024   DTaP/Tdap/Td (3 - Td or Tdap) 12/28/2028   Pneumonia Vaccine 16+ Years old  Completed   HPV VACCINES  Aged Out   Meningococcal B Vaccine  Aged Out    Health Maintenance  Health Maintenance Due  Topic Date Due   Hepatitis C Screening  Never done   Colonoscopy  Never done   Diabetic kidney evaluation - Urine ACR  02/05/2014   OPHTHALMOLOGY EXAM  07/20/2014   HEMOGLOBIN A1C  03/20/2015   Zoster Vaccines- Shingrix (2 of 2) 05/03/2016   COVID-19 Vaccine (6 - 2024-25 season) 06/12/2023   Health Maintenance Items Addressed: Referral sent to GI for colonoscopy, Going to eye doctor in next couple of months. Has appointment in June where A1C and urine can be addressed.   Additional Screening:  Vision Screening: Recommended annual ophthalmology exams for early detection of glaucoma and other disorders of the eye.  Dental Screening: Recommended annual dental exams for proper oral hygiene  Community Resource Referral / Chronic Care Management: CRR required this visit?  No   CCM required this visit?  No     Plan:     I have personally reviewed and noted the following in the  patient's chart:   Medical and social history Use of alcohol, tobacco or illicit drugs  Current medications and supplements including opioid prescriptions. Patient is not currently taking opioid prescriptions. Functional ability and status Nutritional status Physical activity Advanced directives List of other physicians Hospitalizations, surgeries, and ER visits in previous 12 months Vitals Screenings to include cognitive, depression, and falls Referrals and appointments  In addition, I have reviewed and discussed with patient certain preventive protocols, quality metrics, and best practice recommendations. A written personalized care plan for preventive services as well as general preventive health recommendations were provided to patient.     Areatha Beecham, LPN   [id number]   After Visit Summary: (MyChart) Due to this being a telephonic visit, the after visit summary with patients personalized plan was offered to patient via MyChart   Notes: Nothing significant to report at this time.

## 2023-07-17 NOTE — Patient Instructions (Signed)
 John Beasley , Thank you for taking time to come for your Medicare Wellness Visit. I appreciate your ongoing commitment to your health goals. Please review the following plan we discussed and let me know if I can assist you in the future.   Referrals/Orders/Follow-Ups/Clinician Recommendations: none  This is a list of the screening recommended for you and due dates:  Health Maintenance  Topic Date Due   Hepatitis C Screening  Never done   Colon Cancer Screening  Never done   Yearly kidney health urinalysis for diabetes  02/05/2014   Eye exam for diabetics  07/20/2014   Hemoglobin A1C  03/20/2015   Zoster (Shingles) Vaccine (2 of 2) 05/03/2016   COVID-19 Vaccine (6 - 2024-25 season) 06/12/2023   Flu Shot  11/03/2023   Yearly kidney function blood test for diabetes  05/07/2024   Complete foot exam   05/07/2024   Medicare Annual Wellness Visit  07/16/2024   DTaP/Tdap/Td vaccine (3 - Td or Tdap) 12/28/2028   Pneumonia Vaccine  Completed   HPV Vaccine  Aged Out   Meningitis B Vaccine  Aged Out    Advanced directives: (Copy Requested) Please bring a copy of your health care power of attorney and living will to the office to be added to your chart at your convenience. You can mail to [facility] [address] [place], [place] [address] or email to [email or web address]  Next Medicare Annual Wellness Visit scheduled for next year: Yes  insert Preventive Care attachment Insert FALL PREVENTION attachment if needed

## 2023-07-20 DIAGNOSIS — L570 Actinic keratosis: Secondary | ICD-10-CM | POA: Diagnosis not present

## 2023-08-14 ENCOUNTER — Ambulatory Visit: Payer: Medicare PPO | Admitting: Family Medicine

## 2023-08-14 LAB — MICROALBUMIN, URINE: Microalb, Ur: 6

## 2023-08-14 LAB — HEMOGLOBIN A1C: Hemoglobin A1C: 7.1

## 2023-08-17 ENCOUNTER — Encounter: Payer: Self-pay | Admitting: Family Medicine

## 2023-09-04 DIAGNOSIS — E785 Hyperlipidemia, unspecified: Secondary | ICD-10-CM | POA: Diagnosis not present

## 2023-09-04 DIAGNOSIS — E1165 Type 2 diabetes mellitus with hyperglycemia: Secondary | ICD-10-CM | POA: Diagnosis not present

## 2023-09-11 ENCOUNTER — Ambulatory Visit: Admitting: Family Medicine

## 2023-09-11 ENCOUNTER — Encounter: Payer: Self-pay | Admitting: Family Medicine

## 2023-09-11 VITALS — BP 130/68 | HR 69 | Temp 97.6°F | Ht 69.0 in | Wt 187.6 lb

## 2023-09-11 DIAGNOSIS — E782 Mixed hyperlipidemia: Secondary | ICD-10-CM | POA: Diagnosis not present

## 2023-09-11 DIAGNOSIS — Z125 Encounter for screening for malignant neoplasm of prostate: Secondary | ICD-10-CM

## 2023-09-11 DIAGNOSIS — Z794 Long term (current) use of insulin: Secondary | ICD-10-CM | POA: Diagnosis not present

## 2023-09-11 DIAGNOSIS — E119 Type 2 diabetes mellitus without complications: Secondary | ICD-10-CM

## 2023-09-11 DIAGNOSIS — I10 Essential (primary) hypertension: Secondary | ICD-10-CM

## 2023-09-11 DIAGNOSIS — Z7985 Long-term (current) use of injectable non-insulin antidiabetic drugs: Secondary | ICD-10-CM

## 2023-09-11 DIAGNOSIS — E1165 Type 2 diabetes mellitus with hyperglycemia: Secondary | ICD-10-CM | POA: Diagnosis not present

## 2023-09-11 DIAGNOSIS — Z7984 Long term (current) use of oral hypoglycemic drugs: Secondary | ICD-10-CM

## 2023-09-11 DIAGNOSIS — Z1159 Encounter for screening for other viral diseases: Secondary | ICD-10-CM

## 2023-09-11 DIAGNOSIS — E785 Hyperlipidemia, unspecified: Secondary | ICD-10-CM | POA: Diagnosis not present

## 2023-09-11 MED ORDER — LISINOPRIL-HYDROCHLOROTHIAZIDE 20-12.5 MG PO TABS: 2.0000 | ORAL_TABLET | Freq: Every day | ORAL | 3 refills | Status: AC

## 2023-09-11 MED ORDER — REPATHA SURECLICK 140 MG/ML ~~LOC~~ SOAJ: 140.0000 mg | SUBCUTANEOUS | 12 refills | Status: AC

## 2023-09-11 MED ORDER — EZETIMIBE 10 MG PO TABS: 10.0000 mg | ORAL_TABLET | Freq: Every day | ORAL | 3 refills | Status: AC

## 2023-09-11 MED ORDER — FREESTYLE LIBRE 3 PLUS SENSOR MISC: 3 refills | Status: AC

## 2023-09-11 MED ORDER — AMLODIPINE BESYLATE 5 MG PO TABS: 5.0000 mg | ORAL_TABLET | Freq: Every day | ORAL | 3 refills | Status: AC

## 2023-09-11 NOTE — Assessment & Plan Note (Signed)
 BP is in adequate control today. Continue amlodipine 5 mg daily and lisinopril -HCTZ (Zestoretic ) 20-12.5 mg daily.

## 2023-09-11 NOTE — Progress Notes (Signed)
 [facility] - Closed PRIMARY CARE [facility] PRIMARY [facility] [address] [place] [address] Dept: [phone] Dept Fax: [phone]  Chronic Care Office Visit  Subjective:    Patient ID: John Beasley., male    DOB: [birth year], 73 y.o..   MRN: [id number]  Chief Complaint  Patient presents with   Diabetes    3 month f/u DM.     History of Present Illness:  Patient is in today for reassessment of chronic medical issues.  Mr. Michels has hypertension. He is managed on amlodipine 5 mg daily and lisinopril -HCTZ (Zestoretic ) 20-12.5 mg daily.   Mr. Meech has a strong family history of ASCVD. He has hyperlipidemia. He has been intolerant of statins. He is currently prescribed ezetimibe  10 mg daily and Repatha . He notes he has not actually started the Repatha , but plans to start this in 2 weeks, after getting back from a trip.   Mr. Heard has Type 2 diabetes. He is managed on dulaglutide (Trulicity) 1.5 mg weekly, metformin  1000 mg bid, and insulin glargine (Toujeo) 35 units daily. He notes that he was unable to go up on his does of Trulicity, due to nausea. His endocrinology NP has provided him with a 4-week sample of tirzepatide (Mounjaro). He plans to start this once he returns form his trip. He also notes they discussed him adjusting up on his Toujeo prescription every week if his fasting glucose is > 120 on average.  Past Medical History: Patient Active Problem List   Diagnosis Date Noted   Left Achilles tendinitis 05/08/2023   Osteoarthritis of left hip 05/08/2023   Cervical radiculopathy 12/10/2013   NAFL (nonalcoholic fatty liver) 11/22/2006   Hyperhidrosis 11/22/2006   Type 2 diabetes mellitus (HCC) 08/29/2006   Hyperlipidemia 08/29/2006   Essential hypertension 08/29/2006   Acquired cyst of kidney 08/29/2006   Past Surgical History:  Procedure Laterality Date   CHOLECYSTECTOMY N/A 09/30/2014   Procedure: LAPAROSCOPIC CHOLECYSTECTOMY WITH INTRAOPERATIVE  CHOLANGIOGRAM;  Surgeon: Lockie Rima, MD;  Location: [facility] OR;  Service: General;  Laterality: N/A;   IR RADIOLOGIST EVAL & MGMT  06/04/2019   KIDNEY CYST REMOVAL  2007   [place]   Family History  Problem Relation Age of Onset   Stroke Mother    Diabetes Father    Heart disease Father    Peripheral Artery Disease Father    Early death Father    Diabetes Brother    Peripheral Artery Disease Paternal Uncle    AAA (abdominal aortic aneurysm) Maternal Grandfather    Peripheral Artery Disease Paternal Grandfather    Diabetes Other    Hyperlipidemia Other    Hypertension Other    Stroke Other    Skin cancer Other    Outpatient Medications Prior to Visit  Medication Sig Dispense Refill   amLODipine (NORVASC) 5 MG tablet Take 5 mg by mouth daily.     BD PEN NEEDLE NANO 2ND GEN 32G X 4 MM MISC      Dulaglutide (TRULICITY) 1.5 MG/0.5ML SOPN Inject 1.5 mg into the skin once a week.     fluorouracil (EFUDEX) 5 % cream Apply topically 2 (two) times daily.     gabapentin  (NEURONTIN ) 100 MG capsule Take 100 mg by mouth as needed (neck pressure).     insulin glargine, 1 Unit Dial, (TOUJEO SOLOSTAR) 300 UNIT/ML Solostar Pen Inject 30 Units into the skin See admin instructions.     lisinopril -hydrochlorothiazide  (PRINZIDE ,ZESTORETIC ) 20-12.5 MG per tablet Take 2 tablets by mouth daily. 180 tablet 1  metFORMIN  (GLUCOPHAGE ) 1000 MG tablet Take 1,000 mg by mouth 2 (two) times daily with a meal.     Multiple Vitamins-Minerals (MENS MULTIVITAMIN PLUS) TABS Take 1 tablet by mouth daily at 12 noon.     nitroGLYCERIN  (NITRODUR - DOSED IN MG/24 HR) 0.4 mg/hr patch 1/4 patch Transdermal Once a day for 30 day(s)     tiZANidine (ZANAFLEX) 2 MG tablet Take 2 mg by mouth every 8 (eight) hours as needed.     ezetimibe  (ZETIA ) 10 MG tablet Take 1 tablet (10 mg total) by mouth daily. 30 tablet 5   Evolocumab  (REPATHA  SURECLICK) 140 MG/ML SOAJ Inject 140 mg into the skin every 14 (fourteen) days. (Patient not taking:  Reported on 09/11/2023) 2 mL 12   No facility-administered medications prior to visit.   Allergies  Allergen Reactions   Codeine    Pravastatin Sodium    Simvastatin     REACTION: muscle  sore   Objective:   Today's Vitals   09/11/23 1445  BP: 130/68  Pulse: 69  Temp: 97.6 F (36.4 C)  TempSrc: Temporal  SpO2: 98%  Weight: 187 lb 9.6 oz (85.1 kg)  Height: 5\' 9"  (1.753 m)   Body mass index is 27.7 kg/m.   General: Well developed, well nourished. No acute distress. Psych: Alert and oriented. Normal mood and affect.  Health Maintenance Due  Topic Date Due   Hepatitis C Screening  Never done   Colonoscopy  Never done   Diabetic kidney evaluation - Urine ACR  02/05/2014   OPHTHALMOLOGY EXAM  07/20/2014   Zoster Vaccines- Shingrix (2 of 2) 05/03/2016   Lab Results (09/04/2023)  Lipid Panel  Total Cholesterol: 147 mg/dL  Triglycerides:  562 mg/dL  HDL Cholesterol: 38 mg/dL  LDL Cholesterol: 70 mg/dl  Hemoglobin Z3Y: 7.1 %  Comprehensive Metabolic Panel  Sodium: 135 mEq/L  Potassium: 5.8 mEq/L  Chloride: 94 mEq/L  CO2:  22 mmol/L  Glucose: 132 mg/dL  BUN:  8 mg/dL  Creatinine: 1.1 mg/dL  Micral/Cr ratio:  6    Assessment & Plan:   Problem List Items Addressed This Visit       Cardiovascular and Mediastinum   Essential hypertension   BP is in adequate control today. Continue amlodipine 5 mg daily and lisinopril -HCTZ (Zestoretic ) 20-12.5 mg daily.      Relevant Medications   Evolocumab  (REPATHA  SURECLICK) 140 MG/ML SOAJ   amLODipine (NORVASC) 5 MG tablet   lisinopril -hydrochlorothiazide  (ZESTORETIC ) 20-12.5 MG tablet   ezetimibe  (ZETIA ) 10 MG tablet     Endocrine   Type 2 diabetes mellitus (HCC) - Primary   A1c is just above goal. We discussed that I could be managing his diabetes, as his current therapies are within my scope of practice. Continue dulaglutide (Trulicity) 1.5 mg weekly, metformin  1000 mg bid, and insulin glargine (Toujeo) 30 units daily.  Reminded of the importance ot have annual eye exams. He will transition to tirzepatide [name]) in 2 weeks. I am supportive of him increasing his Toujeo dose to 2 units every week until his FBS is < 120 on average or until he gets to 50 units.      Relevant Medications   lisinopril -hydrochlorothiazide  (ZESTORETIC ) 20-12.5 MG tablet   Continuous Glucose Sensor (FREESTYLE LIBRE 3 PLUS SENSOR) MISC   Other Relevant Orders   Glucose, random   Hemoglobin A1c     Other   Hyperlipidemia   Continue ezetimibe  10 mg daily. Plans starting Repatha  in 2 weeks.  Relevant Medications   Evolocumab  (REPATHA  SURECLICK) 140 MG/ML SOAJ   amLODipine (NORVASC) 5 MG tablet   lisinopril -hydrochlorothiazide  (ZESTORETIC ) 20-12.5 MG tablet   ezetimibe  (ZETIA ) 10 MG tablet   Other Relevant Orders   Lipid panel   Other Visit Diagnoses       Encounter for hepatitis C screening test for low risk patient       Relevant Orders   HCV Ab w Reflex to Quant PCR     Screening for prostate cancer       Relevant Orders   PSA, Medicare       Return in about 3 months (around 12/12/2023) for Reassessment.   Graig Lawyer, MD

## 2023-09-11 NOTE — Assessment & Plan Note (Signed)
 A1c is just above goal. We discussed that I could be managing his diabetes, as his current therapies are within my scope of practice. Continue dulaglutide (Trulicity) 1.5 mg weekly, metformin  1000 mg bid, and insulin glargine (Toujeo) 30 units daily. Reminded of the importance ot have annual eye exams. He will transition to tirzepatide [name]) in 2 weeks. I am supportive of him increasing his Toujeo dose to 2 units every week until his FBS is < 120 on average or until he gets to 50 units.

## 2023-09-11 NOTE — Assessment & Plan Note (Signed)
 Continue ezetimibe  10 mg daily. Plans starting Repatha  in 2 weeks.

## 2023-09-13 ENCOUNTER — Other Ambulatory Visit (HOSPITAL_COMMUNITY): Payer: Self-pay

## 2023-12-03 ENCOUNTER — Ambulatory Visit
Admission: RE | Admit: 2023-12-03 | Discharge: 2023-12-03 | Disposition: A | Discharge status: Home / Self Care | Source: Ambulatory Visit | Attending: Internal Medicine | Admitting: Internal Medicine

## 2023-12-03 VITALS — BP 199/74 | HR 61 | Temp 97.7°F | Resp 17

## 2023-12-03 DIAGNOSIS — L089 Local infection of the skin and subcutaneous tissue, unspecified: Secondary | ICD-10-CM | POA: Diagnosis not present

## 2023-12-03 DIAGNOSIS — E11628 Type 2 diabetes mellitus with other skin complications: Secondary | ICD-10-CM | POA: Diagnosis not present

## 2023-12-03 DIAGNOSIS — I10 Essential (primary) hypertension: Secondary | ICD-10-CM | POA: Diagnosis not present

## 2023-12-03 DIAGNOSIS — L02611 Cutaneous abscess of right foot: Secondary | ICD-10-CM | POA: Diagnosis not present

## 2023-12-03 MED ORDER — DOXYCYCLINE HYCLATE 100 MG PO CAPS
100.0000 mg | ORAL_CAPSULE | Freq: Two times a day (BID) | ORAL | 0 refills | Status: AC
Start: 2023-12-03 — End: 2023-12-10

## 2023-12-03 NOTE — ED Triage Notes (Signed)
 Pt states he thinks he got bite by a insect on Friday. C/o bump with swelling and redness on right second to last toe

## 2023-12-03 NOTE — ED Provider Notes (Signed)
 John Beasley    CSN: [id number] Arrival date & time: 12/03/23  9146      History   Chief Complaint Chief Complaint  Patient presents with   Abscess   Insect Bite    HPI John Beasley. is a 73 y.o. male.   John Beasley. is a 73 y.o. male presenting for chief complaint of Abscess and Insect Bite to the dorsum of the right foot between the right third and fourth MTPs.  He suspects he was bit by an insect on Friday, December 01, 2023 (2 days ago).  Area has now become swollen, tender, red, and with purulent drainage.  States the redness and swelling has improved in the last 24 hours since onset.  Lesion is a little itchy, he has been using over-the-counter hydrocortisone cream with relief for itching.  Denies new numbness and tingling to the distal right foot.  He is a type II diabetic, states sugars have been within normal limits.  Denies recent antibiotic or steroid use.  No fever, chills, body aches, or nausea/vomiting reported  Blood pressure is elevated at 199/74.  Patient takes amlodipine  5 mg and losartan hydrochlorothiazide  daily.  He has not taken his blood pressure medication this morning.  He states his blood pressure has been slowly increasing over the last few years and he has a PCP appointment for further evaluation on December 13, 2023. Denies CP, SOB, palpitations, dizziness, extremity weakness, paresthesias.       Past Medical History:  Diagnosis Date   Diabetes mellitus    Diastolic dysfunction    GERD (gastroesophageal reflux disease)    Hyperlipidemia    Hypertension    Renal cyst    Renal insufficiency     Patient Active Problem List   Diagnosis Date Noted   Left Achilles tendinitis 05/08/2023   Osteoarthritis of left hip 05/08/2023   Cervical radiculopathy 12/10/2013   NAFL (nonalcoholic fatty liver) 11/22/2006   Hyperhidrosis 11/22/2006   Type 2 diabetes mellitus (HCC) 08/29/2006   Hyperlipidemia 08/29/2006   Essential hypertension  08/29/2006   Acquired cyst of kidney 08/29/2006    Past Surgical History:  Procedure Laterality Date   CHOLECYSTECTOMY N/A 09/30/2014   Procedure: LAPAROSCOPIC CHOLECYSTECTOMY WITH INTRAOPERATIVE CHOLANGIOGRAM;  Surgeon: Jina Nephew, MD;  Location: [facility] OR;  Service: General;  Laterality: N/A;   IR RADIOLOGIST EVAL & MGMT  06/04/2019   KIDNEY CYST REMOVAL  2007   [place]       Home Medications    Prior to Admission medications   Medication Sig Start Date End Date Taking? Authorizing Provider  doxycycline  (VIBRAMYCIN ) 100 MG capsule Take 1 capsule (100 mg total) by mouth 2 (two) times daily for 7 days. 12/03/23 12/10/23 Yes Akasia Ahmad, Dorna HERO, FNP  amLODipine  (NORVASC ) 5 MG tablet Take 1 tablet (5 mg total) by mouth daily. 09/11/23   Thedora Garnette HERO, MD  BD PEN NEEDLE NANO 2ND GEN 32G X 4 MM MISC  04/23/23   [provider]  Continuous Glucose Sensor (FREESTYLE LIBRE 3 PLUS SENSOR) MISC Change sensor every 15 days. 09/11/23   Thedora Garnette HERO, MD  Dulaglutide (TRULICITY) 1.5 MG/0.5ML SOPN Inject 1.5 mg into the skin once a week.    [provider]  Evolocumab  (REPATHA  SURECLICK) 140 MG/ML SOAJ Inject 140 mg into the skin every 14 (fourteen) days. 09/11/23   Thedora Garnette HERO, MD  ezetimibe  (ZETIA ) 10 MG tablet Take 1 tablet (10 mg total) by mouth daily.  09/11/23   Thedora Garnette HERO, MD  fluorouracil (EFUDEX) 5 % cream Apply topically 2 (two) times daily.    [provider]  gabapentin  (NEURONTIN ) 100 MG capsule Take 100 mg by mouth as needed (neck pressure). 01/11/17   [provider]  insulin glargine, 1 Unit Dial, (TOUJEO SOLOSTAR) 300 UNIT/ML Solostar Pen Inject 30 Units into the skin See admin instructions.    [provider]  lisinopril -hydrochlorothiazide  (ZESTORETIC ) 20-12.5 MG tablet Take 2 tablets by mouth daily. 09/11/23   Thedora Garnette HERO, MD  metFORMIN  (GLUCOPHAGE ) 1000 MG tablet Take 1,000 mg by mouth 2 (two) times daily with a meal.    [provider]  Multiple Vitamins-Minerals (MENS MULTIVITAMIN PLUS) TABS Take 1 tablet by mouth daily at 12 noon.    [provider]  nitroGLYCERIN  (NITRODUR - DOSED IN MG/24 HR) 0.4 mg/hr patch 1/4 patch Transdermal Once a day for 30 day(s) 09/13/22   [provider]  tiZANidine (ZANAFLEX) 2 MG tablet Take 2 mg by mouth every 8 (eight) hours as needed. 12/08/22   [provider]    Family History Family History  Problem Relation Age of Onset   Stroke Mother    Diabetes Father    Heart disease Father    Peripheral Artery Disease Father    Early death Father    Diabetes Brother    Peripheral Artery Disease Paternal Uncle    AAA (abdominal aortic aneurysm) Maternal Grandfather    Peripheral Artery Disease Paternal Grandfather    Diabetes Other    Hyperlipidemia Other    Hypertension Other    Stroke Other    Skin cancer Other     Social History Social History   Tobacco Use   Smoking status: Never   Smokeless tobacco: Never  Vaping Use   Vaping status: Never Used  Substance Use Topics   Alcohol use: Not Currently    Comment: not normally, holiday events only   Drug use: No     Allergies   Codeine, Pravastatin sodium, and Simvastatin   Review of Systems Review of Systems Per HPI  Physical Exam Triage Vital Signs ED Triage Vitals  Encounter Vitals Group     BP 12/03/23 0907 (!) 199/74     Girls Systolic BP Percentile --      Girls Diastolic BP Percentile --      Boys Systolic BP Percentile --      Boys Diastolic BP Percentile --      Pulse Rate 12/03/23 0907 61     Resp 12/03/23 0907 17     Temp 12/03/23 0907 97.7 F (36.5 C)     Temp Source 12/03/23 0907 Oral     SpO2 12/03/23 0907 98 %     Weight --      Height --      Head Circumference --      Peak Flow --      Pain Score 12/03/23 0911 0     Pain Loc --      Pain Education --      Exclude from Growth Chart --    No data found.  Updated Vital Signs BP (!) 199/74 (BP  Location: Right Arm)   Pulse 61   Temp 97.7 F (36.5 C) (Oral)   Resp 17   SpO2 98%   Visual Acuity Right Eye Distance:   Left Eye Distance:   Bilateral Distance:    Right Eye Near:   Left Eye Near:  Bilateral Near:     Physical Exam Vitals and nursing note reviewed.  Constitutional:      Appearance: He is not ill-appearing or toxic-appearing.  HENT:     Head: Normocephalic and atraumatic.     Right Ear: Hearing and external ear normal.     Left Ear: Hearing and external ear normal.     Nose: Nose normal.     Mouth/Throat:     Lips: Pink.  Eyes:     General: Lids are normal. Vision grossly intact. Gaze aligned appropriately.     Extraocular Movements: Extraocular movements intact.     Conjunctiva/sclera: Conjunctivae normal.  Cardiovascular:     Pulses:          Dorsalis pedis pulses are 2+ on the right side.       Posterior tibial pulses are 2+ on the right side.  Pulmonary:     Effort: Pulmonary effort is normal.  Musculoskeletal:     Cervical back: Neck supple.     Right foot: Normal range of motion and normal capillary refill (Less than 2 to distal toes of right foot). Tenderness (Tenderness associated with abscess) present. No bony tenderness or crepitus. Normal pulse (+2 right dorsalis pedis pulse).       Feet:  Skin:    General: Skin is warm and dry.     Capillary Refill: Capillary refill takes less than 2 seconds.     Findings: No rash.  Neurological:     General: No focal deficit present.     Mental Status: He is alert and oriented to person, place, and time. Mental status is at baseline.     Cranial Nerves: No dysarthria or facial asymmetry.  Psychiatric:        Mood and Affect: Mood normal.        Speech: Speech normal.        Behavior: Behavior normal.        Thought Content: Thought content normal.        Judgment: Judgment normal.    Foot lesion   [facility] Treatments / Results  Labs (all labs ordered are listed, but only abnormal results are  displayed) Labs Reviewed - No data to display  EKG   Radiology No results found.  Procedures Procedures (including critical care time)  Medications Ordered in [facility] Medications - No data to display  Initial Impression / Assessment and Plan / Beas[facility] Course  I have reviewed the triage vital signs and the nursing notes.  Pertinent labs & imaging results that were available during my care of the patient were reviewed by me and considered in my medical decision making (see chart for details).   1.  Abscess of toe of right foot, pustular lesion, type 2 diabetes with other skin complication without long-term current use of insulin Infected insect bite/abscess of right foot will be treated with doxycycline  twice daily for 7 days.  Warm compresses encouraged.  There is no current surrounding erythema/signs of cellulitis, however I have advised him to seek care should he develop new/worsening signs of infection.  Neurovascularly intact to distal right lower extremity.   2.  Elevated blood pressure reading in office with diagnosis of hypertension BP elevated in clinic today likely secondary to infection/he has not taken his blood pressure medications this morning. No red flag signs/symptoms indicating need for referral to ED due to elevated BP.  Discussed lifestyle and dietary changes to lower BP further. Advised to continue taking BP medication as prescribed and  follow-up with PCP to discuss management of HTN further.  Advised him to go home and take his blood pressure medications as soon as he gets home.  BP Readings from Last 3 Encounters:  12/03/23 (!) 199/74  09/11/23 130/68  05/08/23 (!) 150/78    Counseled patient on potential for adverse effects with medications prescribed/recommended today, strict ER and return-to-clinic precautions discussed, patient verbalized understanding.    Final Clinical Impressions(s) / [facility] Diagnoses   Final diagnoses:  Elevated blood pressure reading in  office with diagnosis of hypertension  Pustular lesion  Abscess of toe of right foot  Type 2 diabetes mellitus with other skin complication, without long-term current use of insulin [facility])   Discharge Instructions   None    ED Prescriptions     Medication Sig Dispense Auth. Provider   doxycycline  (VIBRAMYCIN ) 100 MG capsule Take 1 capsule (100 mg total) by mouth 2 (two) times daily for 7 days. 14 capsule Enedelia Dorna HERO, FNP      PDMP not reviewed this encounter.   Enedelia Dorna [place], [place] 12/03/23 [phone]

## 2023-12-05 ENCOUNTER — Other Ambulatory Visit

## 2023-12-07 ENCOUNTER — Other Ambulatory Visit (INDEPENDENT_AMBULATORY_CARE_PROVIDER_SITE_OTHER)

## 2023-12-07 ENCOUNTER — Ambulatory Visit: Payer: Self-pay | Admitting: Family Medicine

## 2023-12-07 DIAGNOSIS — E119 Type 2 diabetes mellitus without complications: Secondary | ICD-10-CM

## 2023-12-07 DIAGNOSIS — Z125 Encounter for screening for malignant neoplasm of prostate: Secondary | ICD-10-CM

## 2023-12-07 DIAGNOSIS — Z1159 Encounter for screening for other viral diseases: Secondary | ICD-10-CM

## 2023-12-07 DIAGNOSIS — Z794 Long term (current) use of insulin: Secondary | ICD-10-CM

## 2023-12-07 DIAGNOSIS — E782 Mixed hyperlipidemia: Secondary | ICD-10-CM

## 2023-12-07 LAB — LIPID PANEL
Cholesterol: 131 mg/dL (ref 0–200)
HDL: 41.3 mg/dL (ref >39.00)
LDL Cholesterol: 46 mg/dL (ref 0–99)
NonHDL: 89.94
Total CHOL/HDL Ratio: 3
Triglycerides: 219 mg/dL — ABNORMAL HIGH (ref 0.0–149.0)
VLDL: 43.8 mg/dL — ABNORMAL HIGH (ref 0.0–40.0)

## 2023-12-07 LAB — HEMOGLOBIN A1C: Hgb A1c MFr Bld: 7.1 % — ABNORMAL HIGH (ref 4.6–6.5)

## 2023-12-07 LAB — PSA, MEDICARE: PSA: 0.46 ng/mL (ref 0.10–4.00)

## 2023-12-07 LAB — GLUCOSE, RANDOM: Glucose, Bld: 93 mg/dL (ref 70–99)

## 2023-12-08 LAB — HCV INTERPRETATION

## 2023-12-08 LAB — HCV AB W REFLEX TO QUANT PCR: HCV Ab: NONREACTIVE

## 2023-12-12 ENCOUNTER — Ambulatory Visit: Admitting: Family Medicine

## 2023-12-13 ENCOUNTER — Ambulatory Visit: Admitting: Family Medicine

## 2023-12-13 ENCOUNTER — Encounter: Payer: Self-pay | Admitting: Family Medicine

## 2023-12-13 VITALS — BP 134/70 | HR 81 | Temp 97.6°F | Ht 69.0 in | Wt 186.8 lb

## 2023-12-13 DIAGNOSIS — Z7984 Long term (current) use of oral hypoglycemic drugs: Secondary | ICD-10-CM | POA: Diagnosis not present

## 2023-12-13 DIAGNOSIS — E119 Type 2 diabetes mellitus without complications: Secondary | ICD-10-CM | POA: Diagnosis not present

## 2023-12-13 DIAGNOSIS — Z7184 Encounter for health counseling related to travel: Secondary | ICD-10-CM

## 2023-12-13 DIAGNOSIS — Z794 Long term (current) use of insulin: Secondary | ICD-10-CM

## 2023-12-13 DIAGNOSIS — Z7985 Long-term (current) use of injectable non-insulin antidiabetic drugs: Secondary | ICD-10-CM

## 2023-12-13 DIAGNOSIS — I10 Essential (primary) hypertension: Secondary | ICD-10-CM | POA: Diagnosis not present

## 2023-12-13 DIAGNOSIS — E782 Mixed hyperlipidemia: Secondary | ICD-10-CM

## 2023-12-13 DIAGNOSIS — B351 Tinea unguium: Secondary | ICD-10-CM

## 2023-12-13 DIAGNOSIS — Z23 Encounter for immunization: Secondary | ICD-10-CM

## 2023-12-13 MED ORDER — TIRZEPATIDE 2.5 MG/0.5ML ~~LOC~~ SOAJ: 2.5000 mg | SUBCUTANEOUS | 3 refills | Status: AC

## 2023-12-13 MED ORDER — BD PEN NEEDLE NANO 2ND GEN 32G X 4 MM MISC: 3 refills | Status: AC

## 2023-12-13 MED ORDER — TOUJEO SOLOSTAR 300 UNIT/ML ~~LOC~~ SOPN: 45.0000 [IU] | PEN_INJECTOR | SUBCUTANEOUS | 11 refills | Status: AC

## 2023-12-13 MED ORDER — TYPHOID VACCINE PO CPDR: 1.0000 | DELAYED_RELEASE_CAPSULE | ORAL | 0 refills | Status: AC

## 2023-12-13 NOTE — Assessment & Plan Note (Signed)
 I would be appropriate for him to try an OTC topical agent to see if this will effectively clear his nail fungus issue.

## 2023-12-13 NOTE — Assessment & Plan Note (Signed)
 Continue ezetimibe  10 mg daily and Repatha  every 2 weeks.

## 2023-12-13 NOTE — Assessment & Plan Note (Signed)
 BP is in adequate control today. Continue amlodipine 5 mg daily and lisinopril -HCTZ (Zestoretic ) 20-12.5 mg daily.

## 2023-12-13 NOTE — Assessment & Plan Note (Signed)
 Mr. John Beasley plans a river cruise on the [place] in [place] in Feb. He asked about vaccine recommendations. I will provide a Hep A vaccine today. He could consider Hep B, though his risk of exposure should be low. I will prescribe oral typhoid vaccine for him to complete priro to departure. We discussed avoidance of mosquito bites.

## 2023-12-13 NOTE — Assessment & Plan Note (Signed)
 A1c remains just above goal. As he made the transition to tirzepatide  recently, there has likely not been enough time for this to be reflected in his A1c. Continue tirzepatide  (Mounjaro ) 2.5 mg weekly, metformin  1000 mg bid, and insulin glargine  (Toujeo ) 45 units daily.

## 2023-12-13 NOTE — Progress Notes (Signed)
 [facility] PRIMARY CARE [facility] [address] [facility] [address] [place] [address] Dept: [phone] Dept Fax: [phone]  Chronic Care Office Visit  Subjective:    Patient ID: John Beasley., male    DOB: 01/14/1951, 73 y.o..   MRN: [id number]  Chief Complaint  Patient presents with   Diabetes    3 month f/u DM.     History of Present Illness:  Patient is in today for reassessment of chronic medical issues.  Mr. Marrow has hypertension. He is managed on amlodipine  5 mg daily and lisinopril -HCTZ (Zestoretic ) 20-12.5 mg daily.   [name] has a strong family history of ASCVD. He has hyperlipidemia. He has been intolerant of statins. He is currently prescribed ezetimibe  10 mg daily and Repatha .   [name] has Type 2 diabetes. He is managed on tirzepatide  (Mounjaro ) 2.5 mg weekly (started in the past month), metformin  1000 mg bid, and insulin glargine  (Toujeo ) 45 units daily. Since his last visit, he has been tapering up on his Toujeo  dose every week if his fasting glucose was > 120 on average. He notes his fasting glucose recently has been 100-110. he had a value of 85 recently.  [name] notes he had an insect bite to his right foot about 2 weeks ago. This has been healing. He was seen at [facility] and was treated with doxycycline , which he has completed.  [name] notes he has some fungal nail changes. he has been planning to try an OTC treatment for this.  Past Medical History: Patient Active Problem List   Diagnosis Date Noted   Onychomycosis 12/13/2023   Left Achilles tendinitis 05/08/2023   Osteoarthritis of left hip 05/08/2023   Cervical radiculopathy 12/10/2013   NAFL (nonalcoholic fatty liver) 11/22/2006   Hyperhidrosis 11/22/2006   Type 2 diabetes mellitus (HCC) 08/29/2006   Hyperlipidemia 08/29/2006   Essential hypertension 08/29/2006   Acquired cyst of kidney 08/29/2006   Past Surgical History:  Procedure Laterality Date   CHOLECYSTECTOMY  N/A 09/30/2014   Procedure: LAPAROSCOPIC CHOLECYSTECTOMY WITH INTRAOPERATIVE CHOLANGIOGRAM;  Surgeon: Jina Nephew, MD;  Location: [facility] OR;  Service: General;  Laterality: N/A;   IR RADIOLOGIST EVAL & MGMT  06/04/2019   KIDNEY CYST REMOVAL  2007   [place]   Family History  Problem Relation Age of Onset   Stroke Mother    Diabetes Father    Heart disease Father    Peripheral Artery Disease Father    Early death Father    Diabetes Brother    Peripheral Artery Disease Paternal Uncle    AAA (abdominal aortic aneurysm) Maternal Grandfather    Peripheral Artery Disease Paternal Grandfather    Diabetes Other    Hyperlipidemia Other    Hypertension Other    Stroke Other    Skin cancer Other    Outpatient Medications Prior to Visit  Medication Sig Dispense Refill   amLODipine  (NORVASC ) 5 MG tablet Take 1 tablet (5 mg total) by mouth daily. 90 tablet 3   Continuous Glucose Sensor (FREESTYLE LIBRE 3 PLUS SENSOR) MISC Change sensor every 15 days. 6 each 3   Evolocumab  (REPATHA  SURECLICK) 140 MG/ML SOAJ Inject 140 mg into the skin every 14 (fourteen) days. 2 mL 12   ezetimibe  (ZETIA ) 10 MG tablet Take 1 tablet (10 mg total) by mouth daily. 90 tablet 3   fluorouracil (EFUDEX) 5 % cream Apply topically 2 (two) times daily.     gabapentin  (NEURONTIN ) 100 MG capsule Take 100 mg by mouth  as needed (neck pressure).     lisinopril -hydrochlorothiazide  (ZESTORETIC ) 20-12.5 MG tablet Take 2 tablets by mouth daily. 180 tablet 3   metFORMIN  (GLUCOPHAGE ) 1000 MG tablet Take 1,000 mg by mouth 2 (two) times daily with a meal.     MOUNJARO  5 MG/0.5ML Pen Inject 5 mg into the skin once a week.     Multiple Vitamins-Minerals (MENS MULTIVITAMIN PLUS) TABS Take 1 tablet by mouth daily at 12 noon.     nitroGLYCERIN  (NITRODUR - DOSED IN MG/24 HR) 0.4 mg/hr patch 1/4 patch Transdermal Once a day for 30 day(s)     tirzepatide  (MOUNJARO ) 2.5 MG/0.5ML Pen Inject 2.5 mg into the skin once a week.     tiZANidine (ZANAFLEX) 2  MG tablet Take 2 mg by mouth every 8 (eight) hours as needed.     BD PEN NEEDLE NANO 2ND GEN 32G X 4 MM MISC      insulin glargine , 1 Unit Dial, (TOUJEO  SOLOSTAR) 300 UNIT/ML Solostar Pen Inject 30 Units into the skin See admin instructions. (Patient taking differently: Inject 45 Units into the skin See admin instructions.)     Dulaglutide (TRULICITY) 1.5 MG/0.5ML SOPN Inject 1.5 mg into the skin once a week.     No facility-administered medications prior to visit.   Allergies  Allergen Reactions   Codeine    Pravastatin Sodium    Simvastatin     REACTION: muscle  sore   Objective:   Today's Vitals   12/13/23 1041  BP: 134/70  Pulse: 81  Temp: 97.6 F (36.4 C)  TempSrc: Temporal  SpO2: 99%  Weight: 186 lb 12.8 oz (84.7 kg)  Height: 5' 9 (1.753 m)   Body mass index is 27.59 kg/m.   General: Well developed, well nourished. No acute distress. Feet: The nails show varying degrees of yellow discoloration and some friability of the nail along some   margins. No significant thickening is noted. Psych: Alert and oriented. Normal mood and affect.  Health Maintenance Due  Topic Date Due   Colonoscopy  Never done   Diabetic kidney evaluation - Urine ACR  09/24/2010   OPHTHALMOLOGY EXAM  07/20/2014   Zoster Vaccines- Shingrix (2 of 2) 05/03/2016   Lab Results    Latest Ref Rng & Units 12/07/2023    8:53 AM 10/27/2021   10:38 AM 05/30/2019   10:17 AM  CMP  Glucose 70 - 99 mg/dL 93  765    BUN 8 - 27 mg/dL  16    Creatinine 9.23 - 1.27 mg/dL  8.82  8.99   Sodium 865 - 144 mmol/L  133    Potassium 3.5 - 5.2 mmol/L  5.0    Chloride 96 - 106 mmol/L  95    CO2 20 - 29 mmol/L  23    Calcium 8.6 - 10.2 mg/dL  9.3     Last lipids Lab Results  Component Value Date   CHOL 131 12/07/2023   HDL 41.30 12/07/2023   LDLCALC 46 12/07/2023   LDLDIRECT 116.6 06/19/2012   TRIG 219.0 (H) 12/07/2023   CHOLHDL 3 12/07/2023   Last hemoglobin A1c Lab Results  Component Value Date    HGBA1C 7.1 (H) 12/07/2023   Lab Results  Component Value Date   PSA 0.46 12/07/2023   PSA 0.29 10/28/2010   PSA 0.29 09/23/2009     Assessment & Plan:   Problem List Items Addressed This Visit       Cardiovascular and Mediastinum   Essential hypertension -  Primary   BP is in adequate control today. Continue amlodipine  5 mg daily and lisinopril -HCTZ (Zestoretic ) 20-12.5 mg daily.        Endocrine   Type 2 diabetes mellitus (HCC)   A1c remains just above goal. As he made the transition to tirzepatide  recently, there has likely not been enough time for this to be reflected in his A1c. Continue tirzepatide  (Mounjaro ) 2.5 mg weekly, metformin  1000 mg bid, and insulin glargine  (Toujeo ) 45 units daily.      Relevant Medications   tirzepatide  (MOUNJARO ) 2.5 MG/0.5ML Pen   MOUNJARO  5 MG/0.5ML Pen   BD PEN NEEDLE NANO 2ND GEN 32G X 4 MM MISC   insulin glargine , 1 Unit Dial, (TOUJEO  SOLOSTAR) 300 UNIT/ML Solostar Pen   tirzepatide  (MOUNJARO ) 2.5 MG/0.5ML Pen   Other Relevant Orders   Glucose, random   Hemoglobin A1c     Musculoskeletal and Integument   Onychomycosis   I would be appropriate for him to try an OTC topical agent to see if this will effectively clear his nail fungus issue.      Relevant Medications   typhoid (VIVOTIF) DR capsule     Other   Hyperlipidemia   Continue ezetimibe  10 mg daily and Repatha  every 2 weeks.      Travel advice encounter   Mr. Karman plans a river cruise on the [place] in [place] in Feb. He asked about vaccine recommendations. I will provide a Hep A vaccine today. He could consider Hep B, though his risk of exposure should be low. I will prescribe oral typhoid vaccine for him to complete priro to departure. We discussed avoidance of mosquito bites.       Relevant Medications   typhoid (VIVOTIF) DR capsule   Other Relevant Orders   Hepatitis A vaccine adult IM (Completed)   Other Visit Diagnoses       Need for hepatitis A vaccination        Relevant Orders   Hepatitis A vaccine adult IM (Completed)       Return in about 3 months (around 03/13/2024) for Reassessment.   Garnette CHRISTELLA Simpler, MD

## 2024-01-11 ENCOUNTER — Other Ambulatory Visit (HOSPITAL_BASED_OUTPATIENT_CLINIC_OR_DEPARTMENT_OTHER): Payer: Self-pay

## 2024-01-11 MED ORDER — COMIRNATY 30 MCG/0.3ML IM SUSY
0.3000 mL | PREFILLED_SYRINGE | Freq: Once | INTRAMUSCULAR | 0 refills | Status: AC
  Filled 2024-01-11: qty 0.3, 1d supply, fill #0

## 2024-01-11 MED ORDER — FLUZONE HIGH-DOSE 0.5 ML IM SUSY
0.5000 mL | PREFILLED_SYRINGE | Freq: Once | INTRAMUSCULAR | 0 refills | Status: AC
  Filled 2024-01-11: qty 0.5, 1d supply, fill #0

## 2024-01-16 ENCOUNTER — Ambulatory Visit: Admitting: Podiatry

## 2024-01-16 VITALS — Ht 69.0 in | Wt 186.8 lb

## 2024-01-16 DIAGNOSIS — B351 Tinea unguium: Secondary | ICD-10-CM | POA: Diagnosis not present

## 2024-01-16 MED ORDER — TERBINAFINE HCL 250 MG PO TABS: 250.0000 mg | ORAL_TABLET | Freq: Every day | ORAL | 0 refills | Status: AC

## 2024-01-16 NOTE — Progress Notes (Signed)
  Subjective:  Patient ID: John Beasley., male    DOB: Mar 10, 1951,  MRN: [id number]  Chief Complaint  Patient presents with   Nail Problem    Rm 3 Patient is here for nail fungus primarily of the left 1st-3rd toes ( yellowing and thickening of the nails). Patient has not tried any otc meds. Condition present x 6 months.    Discussed the use of AI scribe software for clinical note transcription with the patient, who gave verbal consent to proceed.  History of Present Illness Xzander Beasley. is a 73 year old male with diabetes who presents with toenail fungus.  He is concerned about the toenail fungus and is seeking effective treatment options.  He has a history of diabetes, which he describes as fairly well controlled. He is currently using Mounjaro , having switched from Trulicity, and reports a recent improvement in his blood sugar levels, noting a drop to around 6.5 from previous levels of 7 to 7.2.  He mentions a past history of benign kidney cysts, which were managed with drainage, but states that his kidney function has been stable for the past 6 to 10 years.      Objective:    Physical Exam VASCULAR: DP and PT pulse palpable. Foot is warm and well-perfused. Capillary fill time is brisk. DERMATOLOGIC: Onychomycosis of the left hallux and third toe. Normal skin turgor, texture, and temperature. No open lesions, rashes, or ulcerations. NEUROLOGIC: Normal sensation to light touch and pressure. No paresthesias. ORTHOPEDIC: Smooth, pain-free range of motion of all examined joints. No ecchymosis or bruising. No gross deformity. No pain to palpation.       Results LFTs 2023 normal    Assessment:   1. Onychomycosis      Plan:  Patient was evaluated and treated and all questions answered.  Assessment and Plan Assessment & Plan Onychomycosis of left hallux and third toe Chronic onychomycosis of the left hallux and third toe, superficial and light. Treatable with oral  antifungal medications with a high success rate. Liver and kidney function are stable, but monitoring is recommended due to potential impact of terbinafine on liver function. Terbinafine has an 80-90% success rate. 20% may experience GI issues, and rare cases of temporary loss of taste or smell reported. - Prescribe terbinafine (Lamisil) once daily for 90 days. - Order liver function tests (LFTs) in six weeks. - Schedule follow-up in four months to re-evaluate the condition and review photographs taken today.  Type 2 diabetes mellitus without complications Type 2 diabetes mellitus, well-controlled with Mounjaro . Recent switch from Trulicity to Mounjaro  has improved glycemic control, targeting HbA1c of 6.5%.      Return in about 4 months (around 05/18/2024) for follow up after nail fungus treatment.

## 2024-01-16 NOTE — Patient Instructions (Addendum)
  VISIT SUMMARY: Today, you were seen for concerns about toenail fungus and to review your diabetes management. We discussed treatment options for the toenail fungus and reviewed your current diabetes medication and blood sugar levels.  YOUR PLAN: -ONYCHOMYCOSIS (TOENAIL FUNGUS): Onychomycosis is a fungal infection of the toenails. We will treat this with an oral antifungal medication called terbinafine, which you will take once daily for 90 days. This medication has a high success rate, but we will monitor your liver function with a blood test in six weeks to ensure it is safe for you. We will also schedule a follow-up appointment in four months to check on your progress and review the photographs taken today.    INSTRUCTIONS: Please take terbinafine (Lamisil) once daily for 90 days. Schedule a liver function test (LFT) in six weeks. We will have a follow-up appointment in four months to re-evaluate your toenail fungus and review the photographs taken today.                      Contains text generated by Abridge.                                 Contains text generated by Abridge.

## 2024-02-19 DIAGNOSIS — L821 Other seborrheic keratosis: Secondary | ICD-10-CM | POA: Diagnosis not present

## 2024-02-19 DIAGNOSIS — L244 Irritant contact dermatitis due to drugs in contact with skin: Secondary | ICD-10-CM | POA: Diagnosis not present

## 2024-02-19 DIAGNOSIS — L814 Other melanin hyperpigmentation: Secondary | ICD-10-CM | POA: Diagnosis not present

## 2024-02-19 DIAGNOSIS — Z85828 Personal history of other malignant neoplasm of skin: Secondary | ICD-10-CM | POA: Diagnosis not present

## 2024-02-19 DIAGNOSIS — Z08 Encounter for follow-up examination after completed treatment for malignant neoplasm: Secondary | ICD-10-CM | POA: Diagnosis not present

## 2024-02-19 DIAGNOSIS — L570 Actinic keratosis: Secondary | ICD-10-CM | POA: Diagnosis not present

## 2024-02-19 DIAGNOSIS — L820 Inflamed seborrheic keratosis: Secondary | ICD-10-CM | POA: Diagnosis not present

## 2024-02-19 DIAGNOSIS — D1801 Hemangioma of skin and subcutaneous tissue: Secondary | ICD-10-CM | POA: Diagnosis not present

## 2024-02-19 DIAGNOSIS — L538 Other specified erythematous conditions: Secondary | ICD-10-CM | POA: Diagnosis not present

## 2024-02-20 ENCOUNTER — Encounter: Payer: Self-pay | Admitting: Pharmacist

## 2024-03-03 ENCOUNTER — Encounter: Payer: Self-pay | Admitting: Family Medicine

## 2024-03-03 DIAGNOSIS — Z79899 Other long term (current) drug therapy: Secondary | ICD-10-CM

## 2024-03-06 ENCOUNTER — Other Ambulatory Visit (INDEPENDENT_AMBULATORY_CARE_PROVIDER_SITE_OTHER)

## 2024-03-06 DIAGNOSIS — E119 Type 2 diabetes mellitus without complications: Secondary | ICD-10-CM | POA: Diagnosis not present

## 2024-03-06 DIAGNOSIS — Z794 Long term (current) use of insulin: Secondary | ICD-10-CM

## 2024-03-06 DIAGNOSIS — Z79899 Other long term (current) drug therapy: Secondary | ICD-10-CM | POA: Diagnosis not present

## 2024-03-06 LAB — HEPATIC FUNCTION PANEL
ALT: 25 U/L (ref 0–53)
AST: 24 U/L (ref 0–37)
Albumin: 4.4 g/dL (ref 3.5–5.2)
Alkaline Phosphatase: 37 U/L — ABNORMAL LOW (ref 39–117)
Bilirubin, Direct: 0.1 mg/dL (ref 0.0–0.3)
Total Bilirubin: 0.4 mg/dL (ref 0.2–1.2)
Total Protein: 7.2 g/dL (ref 6.0–8.3)

## 2024-03-06 LAB — HEMOGLOBIN A1C: Hgb A1c MFr Bld: 6.4 % (ref 4.6–6.5)

## 2024-03-06 LAB — GLUCOSE, RANDOM: Glucose, Bld: 97 mg/dL (ref 70–99)

## 2024-03-12 DIAGNOSIS — G720 Drug-induced myopathy: Secondary | ICD-10-CM | POA: Insufficient documentation

## 2024-03-13 ENCOUNTER — Encounter: Payer: Self-pay | Admitting: Family Medicine

## 2024-03-13 ENCOUNTER — Ambulatory Visit: Admitting: Family Medicine

## 2024-03-13 VITALS — BP 138/70 | HR 56 | Temp 98.2°F | Ht 69.0 in | Wt 188.8 lb

## 2024-03-13 DIAGNOSIS — Z794 Long term (current) use of insulin: Secondary | ICD-10-CM

## 2024-03-13 DIAGNOSIS — L570 Actinic keratosis: Secondary | ICD-10-CM | POA: Insufficient documentation

## 2024-03-13 DIAGNOSIS — Z7985 Long-term (current) use of injectable non-insulin antidiabetic drugs: Secondary | ICD-10-CM

## 2024-03-13 DIAGNOSIS — E782 Mixed hyperlipidemia: Secondary | ICD-10-CM

## 2024-03-13 DIAGNOSIS — Z7984 Long term (current) use of oral hypoglycemic drugs: Secondary | ICD-10-CM

## 2024-03-13 DIAGNOSIS — Z7184 Encounter for health counseling related to travel: Secondary | ICD-10-CM

## 2024-03-13 DIAGNOSIS — G720 Drug-induced myopathy: Secondary | ICD-10-CM

## 2024-03-13 DIAGNOSIS — M25552 Pain in left hip: Secondary | ICD-10-CM | POA: Insufficient documentation

## 2024-03-13 DIAGNOSIS — E119 Type 2 diabetes mellitus without complications: Secondary | ICD-10-CM

## 2024-03-13 DIAGNOSIS — Z85828 Personal history of other malignant neoplasm of skin: Secondary | ICD-10-CM | POA: Insufficient documentation

## 2024-03-13 DIAGNOSIS — T466X5A Adverse effect of antihyperlipidemic and antiarteriosclerotic drugs, initial encounter: Secondary | ICD-10-CM

## 2024-03-13 DIAGNOSIS — I10 Essential (primary) hypertension: Secondary | ICD-10-CM

## 2024-03-13 MED ORDER — AZITHROMYCIN 500 MG PO TABS: 500.0000 mg | ORAL_TABLET | Freq: Every day | ORAL | 0 refills | Status: AC

## 2024-03-13 NOTE — Assessment & Plan Note (Signed)
 Although his record notes prior OA of the left hip, his current symptoms appear to be more of a trochanteric bursitis issue. I will refer him to Sports Medicine.

## 2024-03-13 NOTE — Assessment & Plan Note (Addendum)
 A1c is now at goal. Continue tirzepatide  (Mounjaro ) 2.5 mg weekly, metformin  1000 mg bid, and insulin glargine  (Toujeo ) 45 units daily.

## 2024-03-13 NOTE — Assessment & Plan Note (Addendum)
 BP is mildly high today. We will monitor this for now.  Continue amlodipine  5 mg daily and lisinopril -HCTZ (Zestoretic ) 20-12.5 mg daily.

## 2024-03-13 NOTE — Progress Notes (Signed)
 [facility] PRIMARY CARE LB [facility]IMARY [address] [address] [facility] [address] [place] [address] Dept: [phone] Dept Fax: [phone]  Chronic Care Office Visit  Subjective:    Patient ID: John Beasley., male    DOB: June 28, 1950, 73 y.o..   MRN: [id number]  Chief Complaint  Patient presents with   Hypertension    3 month f/u HTN/DM.     History of Present Illness:  Patient is in today for reassessment of chronic medical conditions.   Mr. Lundberg has hypertension. He is managed on amlodipine  5 mg daily and lisinopril -HCTZ (Zestoretic ) 20-12.5 mg daily.   Mr. Dagostino has a strong family history of ASCVD. He has hyperlipidemia. He has been intolerant of statins. He is currently prescribed ezetimibe  10 mg daily. He has been prescribed Repatha , but has never started this. He wants to get his diabetes in better control before he initiates this. He is concerned with the possible side effects this drug may have..   Mr. Schroepfer has Type 2 diabetes. He is managed on tirzepatide  (Mounjaro ) 2.5 mg weekly, metformin  1000 mg bid, and insulin glargine  (Toujeo ) 45 units daily. He is pleased with the improvement in his diabetes.  Mr. Buckles notes an issue for some months with pain in his left groin and around the left lateral hip. He does not recall any specific injury. he finds there are certain activities that cause more pain and that he does not tolerate.  Past Medical History: Patient Active Problem List   Diagnosis Date Noted   Statin myopathy 03/12/2024   Onychomycosis 12/13/2023   Travel advice encounter 12/13/2023   Left Achilles tendinitis 05/08/2023   Osteoarthritis of left hip 05/08/2023   Cervical radiculopathy 12/10/2013   NAFL (nonalcoholic fatty liver) 11/22/2006   Hyperhidrosis 11/22/2006   Type 2 diabetes mellitus (HCC) 08/29/2006   Hyperlipidemia 08/29/2006   Essential hypertension 08/29/2006   Acquired cyst of kidney 08/29/2006   Past Surgical History:  Procedure  Laterality Date   CHOLECYSTECTOMY N/A 09/30/2014   Procedure: LAPAROSCOPIC CHOLECYSTECTOMY WITH INTRAOPERATIVE CHOLANGIOGRAM;  Surgeon: Jina Nephew, MD;  Location: [facility] OR;  Service: General;  Laterality: N/A;   IR RADIOLOGIST EVAL & MGMT  06/04/2019   KIDNEY CYST REMOVAL  2007   [place]   Family History  Problem Relation Age of Onset   Stroke Mother    Diabetes Father    Heart disease Father    Peripheral Artery Disease Father    Early death Father    Diabetes Brother    Peripheral Artery Disease Paternal Uncle    AAA (abdominal aortic aneurysm) Maternal Grandfather    Peripheral Artery Disease Paternal Grandfather    Diabetes Other    Hyperlipidemia Other    Hypertension Other    Stroke Other    Skin cancer Other    Outpatient Medications Prior to Visit  Medication Sig Dispense Refill   amLODipine  (NORVASC ) 5 MG tablet Take 1 tablet (5 mg total) by mouth daily. 90 tablet 3   BD PEN NEEDLE NANO 2ND GEN 32G X 4 MM MISC Use with insulin injection daily 90 each 3   Continuous Glucose Sensor (FREESTYLE LIBRE 3 PLUS SENSOR) MISC Change sensor every 15 days. 6 each 3   Evolocumab  (REPATHA  SURECLICK) 140 MG/ML SOAJ Inject 140 mg into the skin every 14 (fourteen) days. 2 mL 12   ezetimibe  (ZETIA ) 10 MG tablet Take 1 tablet (10 mg total) by mouth daily. 90 tablet 3   fluorouracil (EFUDEX) 5 % cream Apply topically  2 (two) times daily.     gabapentin  (NEURONTIN ) 100 MG capsule Take 100 mg by mouth as needed (neck pressure).     insulin glargine , 1 Unit Dial, (TOUJEO  SOLOSTAR) 300 UNIT/ML Solostar Pen Inject 45 Units into the skin See admin instructions. 6 mL 11   lisinopril -hydrochlorothiazide  (ZESTORETIC ) 20-12.5 MG tablet Take 2 tablets by mouth daily. 180 tablet 3   metFORMIN  (GLUCOPHAGE ) 1000 MG tablet Take 1,000 mg by mouth 2 (two) times daily with a meal.     MOUNJARO  5 MG/0.5ML Pen Inject 5 mg into the skin once a week.     Multiple Vitamins-Minerals (MENS MULTIVITAMIN PLUS) TABS Take 1  tablet by mouth daily at 12 noon.     nitroGLYCERIN  (NITRODUR - DOSED IN MG/24 HR) 0.4 mg/hr patch 1/4 patch Transdermal Once a day for 30 day(s)     terbinafine  (LAMISIL ) 250 MG tablet Take 1 tablet (250 mg total) by mouth daily. 90 tablet 0   tirzepatide  (MOUNJARO ) 2.5 MG/0.5ML Pen Inject 2.5 mg into the skin once a week.     tirzepatide  (MOUNJARO ) 2.5 MG/0.5ML Pen Inject 2.5 mg into the skin once a week. 6 mL 3   tiZANidine (ZANAFLEX) 2 MG tablet Take 2 mg by mouth every 8 (eight) hours as needed.     typhoid (VIVOTIF) DR capsule Take 1 capsule by mouth every other day. 4 capsule 0   No facility-administered medications prior to visit.   Allergies  Allergen Reactions   Codeine    Pravastatin Sodium    Simvastatin     REACTION: muscle  sore   Statins Other (See Comments)    Myopathy     Objective:   Today's Vitals   03/13/24 1039  BP: 138/70  Pulse: (!) 56  Temp: 98.2 F (36.8 C)  TempSrc: Temporal  SpO2: 100%  Weight: 188 lb 12.8 oz (85.6 kg)  Height: 5' 9 (1.753 m)   Body mass index is 27.88 kg/m.   General: Well developed, well nourished. No acute distress. Extremities: Full ROM of left leg. Pain on palpation high in the left leg, near the groin. Pain also over the left greater trochanter. Pain worsened   laterally with figure-4 and resisted external rotation of the hip. Psych: Alert and oriented. Normal mood and affect.  Health Maintenance Due  Topic Date Due   Colonoscopy  Never done   Diabetic kidney evaluation - Urine ACR  09/24/2010   Zoster Vaccines- Shingrix (2 of 2) 01/06/2020   OPHTHALMOLOGY EXAM  02/03/2021   Lab Results Lab Results  Component Value Date   HGBA1C 6.4 03/06/2024   HGBA1C 7.1 (H) 12/07/2023   HGBA1C 7.1 08/14/2023   Lipid Panel:  Lab Results  Component Value Date   CHOL 131 12/07/2023   HDL 41.30 12/07/2023   LDLCALC 46 12/07/2023   TRIG 219.0 (H) 12/07/2023    Assessment & Plan:   Problem List Items Addressed This Visit        Cardiovascular and Mediastinum   Essential hypertension   BP is mildly high today. We will monitor this for now.  Continue amlodipine  5 mg daily and lisinopril -HCTZ (Zestoretic ) 20-12.5 mg daily.        Endocrine   Type 2 diabetes mellitus (HCC)   A1c is now at goal. Continue tirzepatide  (Mounjaro ) 2.5 mg weekly, metformin  1000 mg bid, and insulin glargine  (Toujeo ) 45 units daily.      Relevant Orders   Lipid panel   Microalbumin / creatinine urine ratio  Basic metabolic panel with GFR   Hemoglobin A1c   Urinalysis, Routine w reflex microscopic     Musculoskeletal and Integument   Statin myopathy - Primary   Relevant Orders   Lipid panel     Other   Hyperlipidemia   Continue ezetimibe  10 mg daily. I encouraged Mr. Kroeker to start taking Repatha . He should follow-up with cardiology with his concerns.      Relevant Orders   Lipid panel   Lateral pain of left hip   Although his record notes prior OA of the left hip, his current symptoms appear to be more of a trochanteric bursitis issue. I will refer him to Sports Medicine.       Relevant Orders   Ambulatory referral to Sports Medicine   Travel advice encounter   I will provide azithromycin for him to have on hand to manage traveler's diarrhea, should this occur during his upcoming trip to [place].       Relevant Medications   azithromycin (ZITHROMAX) 500 MG tablet   Other Visit Diagnoses       Long term current use of oral hypoglycemic drug         Encounter for long-term (current) insulin use (HCC)         Long-term current use of injectable noninsulin antidiabetic medication           Return in about 3 months (around 06/11/2024) for Reassessment.   Garnette CHRISTELLA Simpler, MD  I,Emily Lagle,acting as a scribe for Garnette CHRISTELLA Simpler, MD.,have documented all relevant documentation on the behalf of Garnette CHRISTELLA Simpler, MD.  I, Garnette CHRISTELLA Simpler, MD, have reviewed all documentation for this visit. The documentation on 03/13/2024  for the exam, diagnosis, procedures, and orders are all accurate and complete.

## 2024-03-13 NOTE — Assessment & Plan Note (Signed)
 I will provide azithromycin for him to have on hand to manage traveler's diarrhea, should this occur during his upcoming trip to [place].

## 2024-03-13 NOTE — Assessment & Plan Note (Addendum)
 Continue ezetimibe  10 mg daily. I encouraged Mr. Weekly to start taking Repatha . He should follow-up with cardiology with his concerns.

## 2024-03-14 ENCOUNTER — Other Ambulatory Visit (HOSPITAL_BASED_OUTPATIENT_CLINIC_OR_DEPARTMENT_OTHER): Payer: Self-pay

## 2024-03-14 MED ORDER — AREXVY 120 MCG/0.5ML IM SUSR
INTRAMUSCULAR | 0 refills | Status: AC
  Filled 2024-03-14: qty 1, 1d supply, fill #0

## 2024-03-15 NOTE — Progress Notes (Signed)
 [facility] [address] [place] [address] Phone: [phone]   Assessment and Plan:     1. 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  Tolerated well per note below - Start HEP targeting gluteal musculature  - Use meloxicam  15 mg daily as needed for breakthrough pain.  Recommend limiting chronic NSAIDs to 1-2 doses per week to prevent long-term side effects. Use Tylenol  500 to 1000 mg tablets 2-3 times a day as needed for day-to-day pain relief.     15 additional minutes spent for educating Therapeutic Home Exercise Program.  This included exercises focusing on stretching, strengthening, with focus on eccentric aspects.   Long term goals include an improvement in range of motion, strength, endurance as well as avoiding reinjury. Patient's frequency would include in 1-2 times a day, 3-5 times a week for a duration of 6-12 weeks. Proper technique shown and discussed handout in great detail with ATC.  All questions were discussed and answered.    Procedure: Greater trochanteric bursal injection Side: Left  Risks explained and consent was given verbally. The site was cleaned with alcohol prep. A steroid injection was performed with patient in the lateral side-lying position at area of maximum tenderness over greater trochanter using 2mL of 1% lidocaine  without epinephrine  and 1mL of kenalog 40mg /ml. This was well tolerated.  Needle was removed, hemostasis achieved, and post injection instructions were explained.  Pt was advised to call or return to clinic if these symptoms worsen or fail to improve as anticipated.    Pertinent previous records reviewed include none   Follow Up: As needed if no improvement or worsening of symptoms.  Could consider repeat CSI versus NSAID  course versus physical therapy   Subjective:   I, John Beasley, am serving as a neurosurgeon for Doctor Morene Mace  Chief Complaint: left hip pain   HPI:   03/18/2024 Patient is a 73 year old male with left hip pain. Patient states pain started earlier this year. No MOI. No radiaiting pain. Ibu and tylenol  PRN and that helps. Decreased ROM. Pain is increased when sitting or standing for to long. No numbness or tingling. Pain is increasing    Relevant Historical Information: DM type II, hypertension  Additional pertinent review of systems negative.  Current Medications[1]   Objective:     Vitals:   03/18/24 1443  BP: 130/82  Pulse: 65  SpO2: 100%  Weight: 188 lb (85.3 kg)  Height: 5' 9 (1.753 m)      Body mass index is 27.76 kg/m.    Physical Exam:    General: awake, alert, and oriented no acute distress, nontoxic Skin: no suspicious lesions or rashes Neuro:sensation intact distally with no deficits, normal muscle tone, no atrophy, strength 5/5 in all tested lower ext groups Psych: normal mood and affect, speech clear   Left hip: No deformity, swelling or wasting ROM Flexion 90, ext 30, IR 45, ER 45 TTP greater trochanter, mild along gluteal musculature NTTP over the hip flexors,  si joint, lumbar spine Negative log roll with FROM Negative FABER Positive FADIR for groin pain that patient states feels different from his typical lateral hip pain Negative Piriformis test, however test reproduced nonradiating lateral hip pain Negative trendelenberg Gait normal    Electronically signed by:  Odis Mace [facility] 3:09  PM 03/18/2024     [1]  Current Outpatient Medications:    amLODipine  (NORVASC ) 5 MG tablet, Take 1 tablet (5 mg total) by mouth daily., Disp: 90 tablet, Rfl: 3   BD PEN NEEDLE NANO 2ND GEN 32G X 4 MM MISC, Use with insulin injection daily, Disp: 90 each, Rfl: 3   Continuous Glucose Sensor (FREESTYLE LIBRE 3 PLUS  SENSOR) MISC, Change sensor every 15 days., Disp: 6 each, Rfl: 3   ezetimibe  (ZETIA ) 10 MG tablet, Take 1 tablet (10 mg total) by mouth daily., Disp: 90 tablet, Rfl: 3   fluorouracil (EFUDEX) 5 % cream, Apply topically 2 (two) times daily., Disp: , Rfl:    gabapentin  (NEURONTIN ) 100 MG capsule, Take 100 mg by mouth as needed (neck pressure)., Disp: , Rfl:    insulin glargine , 1 Unit Dial, (TOUJEO  SOLOSTAR) 300 UNIT/ML Solostar Pen, Inject 45 Units into the skin See admin instructions., Disp: 6 mL, Rfl: 11   lisinopril -hydrochlorothiazide  (ZESTORETIC ) 20-12.5 MG tablet, Take 2 tablets by mouth daily., Disp: 180 tablet, Rfl: 3   meloxicam  (MOBIC ) 15 MG tablet, Take 1 tablet (15 mg total) by mouth daily as needed for pain., Disp: 30 tablet, Rfl: 0   metFORMIN  (GLUCOPHAGE ) 1000 MG tablet, Take 1,000 mg by mouth 2 (two) times daily with a meal., Disp: , Rfl:    Multiple Vitamins-Minerals (MENS MULTIVITAMIN PLUS) TABS, Take 1 tablet by mouth daily at 12 noon., Disp: , Rfl:    nitroGLYCERIN  (NITRODUR - DOSED IN MG/24 HR) 0.4 mg/hr patch, 1/4 patch Transdermal Once a day for 30 day(s), Disp: , Rfl:    terbinafine  (LAMISIL ) 250 MG tablet, Take 1 tablet (250 mg total) by mouth daily., Disp: 90 tablet, Rfl: 0   tirzepatide  (MOUNJARO ) 2.5 MG/0.5ML Pen, Inject 2.5 mg into the skin once a week., Disp: 6 mL, Rfl: 3   tiZANidine (ZANAFLEX) 2 MG tablet, Take 2 mg by mouth every 8 (eight) hours as needed., Disp: , Rfl:    typhoid (VIVOTIF) DR capsule, Take 1 capsule by mouth every other day., Disp: 4 capsule, Rfl: 0   Evolocumab  (REPATHA  SURECLICK) 140 MG/ML SOAJ, Inject 140 mg into the skin every 14 (fourteen) days. (Patient not taking: Reported on 03/13/2024), Disp: 2 mL, Rfl: 12   RSV vaccine recomb adjuvanted (AREXVY ) 120 MCG/0.5ML injection, Inject into the muscle., Disp: 1 each, Rfl: 0

## 2024-03-18 ENCOUNTER — Ambulatory Visit: Admitting: Sports Medicine

## 2024-03-18 VITALS — BP 130/82 | HR 65 | Ht 69.0 in | Wt 188.0 lb

## 2024-03-18 DIAGNOSIS — M7062 Trochanteric bursitis, left hip: Secondary | ICD-10-CM | POA: Diagnosis not present

## 2024-03-18 MED ORDER — MELOXICAM 15 MG PO TABS: 15.0000 mg | ORAL_TABLET | Freq: Every day | ORAL | 0 refills | Status: AC | PRN

## 2024-03-18 NOTE — Patient Instructions (Signed)
-   Use meloxicam  15 mg daily as needed for breakthrough pain.  Recommend limiting chronic NSAIDs to 1-2 doses per week to prevent long-term side effects. Use Tylenol  500 to 1000 mg tablets 2-3 times a day as needed for day-to-day pain relief.    Glute HEP   As needed follow up

## 2024-03-25 ENCOUNTER — Other Ambulatory Visit: Payer: Self-pay

## 2024-03-25 NOTE — Telephone Encounter (Signed)
 Refill request for  Metformin  ER 500 mg LR   HX provider  LOV 11/16/23 FOV 05/23/24  Please review and advise.  Thanks.  Dm/cma

## 2024-03-26 MED ORDER — METFORMIN HCL 1000 MG PO TABS: 1000.0000 mg | ORAL_TABLET | Freq: Two times a day (BID) | ORAL | 1 refills | Status: AC

## 2024-04-03 ENCOUNTER — Ambulatory Visit: Payer: Self-pay

## 2024-04-03 NOTE — Telephone Encounter (Signed)
 FYI Only or Action Required?: Action required by provider: request for appointment.  Patient was last seen in primary care on 03/13/2024 by Thedora Garnette HERO, MD.  Called Nurse Triage reporting Back Pain.  Symptoms began a week ago.  Interventions attempted: OTC medications: tylenol .  Symptoms are: stable.  Triage Disposition: See PCP When Office is Open (Within 3 Days)  Patient/caregiver understands and will follow disposition?: No, refuses disposition  Reason for Disposition  [1] MODERATE back pain (e.g., interferes with normal activities) AND [2] present > 3 days  Answer Assessment - Initial Assessment Questions Patient is requesting appointment Tuesday or later. Unable to schedule that far out due to dispo. Told patient office will call him to schedule.  1. ONSET: When did the pain begin? (e.g., minutes, hours, days)     1 week ago 2. LOCATION: Where does it hurt? (upper, mid or lower back)     Lower Right side of back kidney area hx kidney cyst that had to be drained 3. SEVERITY: How bad is the pain?  (e.g., Scale 1-10; mild, moderate, or severe)     4 at it's worst 4. PATTERN: Is the pain constant? (e.g., yes, no; constant, intermittent)      Constant but worsens if movement 5. RADIATION: Does the pain shoot into your legs or somewhere else?     Denies 6. CAUSE:  What do you think is causing the back pain?      Maybe kidneys 8. MEDICINES: What have you taken so far for the pain? (e.g., nothing, acetaminophen , NSAIDS)     Tylenol  9. NEUROLOGIC SYMPTOMS: Do you have any weakness, numbness, or problems with bowel/bladder control?     Denies 10. OTHER SYMPTOMS: Do you have any other symptoms? (e.g., fever, abdomen pain, burning with urination, blood in urine)       Denies  Protocols used: Back Pain-A-AH

## 2024-04-05 NOTE — Telephone Encounter (Signed)
 Called patient and scheduled him an appointment for 04/08/24 @ 2:20 pm. Dm/cma

## 2024-04-08 ENCOUNTER — Ambulatory Visit: Payer: Self-pay | Admitting: Family Medicine

## 2024-04-08 ENCOUNTER — Ambulatory Visit: Admitting: Family Medicine

## 2024-04-08 ENCOUNTER — Encounter: Payer: Self-pay | Admitting: Family Medicine

## 2024-04-08 VITALS — BP 134/70 | HR 74 | Temp 98.1°F | Ht 69.0 in | Wt 187.8 lb

## 2024-04-08 DIAGNOSIS — R10A1 Flank pain, right side: Secondary | ICD-10-CM

## 2024-04-08 DIAGNOSIS — N281 Cyst of kidney, acquired: Secondary | ICD-10-CM

## 2024-04-08 LAB — BASIC METABOLIC PANEL WITH GFR
BUN: 19 mg/dL (ref 6–23)
CO2: 29 meq/L (ref 19–32)
Calcium: 9.3 mg/dL (ref 8.4–10.5)
Chloride: 98 meq/L (ref 96–112)
Creatinine, Ser: 1.15 mg/dL (ref 0.40–1.50)
GFR: 64.21 mL/min
Glucose, Bld: 115 mg/dL — ABNORMAL HIGH (ref 70–99)
Potassium: 5.4 meq/L — ABNORMAL HIGH (ref 3.5–5.1)
Sodium: 133 meq/L — ABNORMAL LOW (ref 135–145)

## 2024-04-08 LAB — URINALYSIS, ROUTINE W REFLEX MICROSCOPIC
Bilirubin Urine: NEGATIVE
Hgb urine dipstick: NEGATIVE
Ketones, ur: NEGATIVE
Leukocytes,Ua: NEGATIVE
Nitrite: NEGATIVE
RBC / HPF: NONE SEEN
Specific Gravity, Urine: 1.01 (ref 1.000–1.030)
Total Protein, Urine: NEGATIVE
Urine Glucose: NEGATIVE
Urobilinogen, UA: 0.2 (ref 0.0–1.0)
WBC, UA: NONE SEEN
pH: 7 (ref 5.0–8.0)

## 2024-04-08 LAB — CBC WITH DIFFERENTIAL/PLATELET
Basophils Absolute: 0.1 K/uL (ref 0.0–0.1)
Basophils Relative: 0.7 % (ref 0.0–3.0)
Eosinophils Absolute: 0 K/uL (ref 0.0–0.7)
Eosinophils Relative: 0.4 % (ref 0.0–5.0)
HCT: 40.1 % (ref 39.0–52.0)
Hemoglobin: 13.9 g/dL (ref 13.0–17.0)
Lymphocytes Relative: 22.4 % (ref 12.0–46.0)
Lymphs Abs: 1.7 K/uL (ref 0.7–4.0)
MCHC: 34.8 g/dL (ref 30.0–36.0)
MCV: 93.6 fl (ref 78.0–100.0)
Monocytes Absolute: 0.4 K/uL (ref 0.1–1.0)
Monocytes Relative: 5.8 % (ref 3.0–12.0)
Neutro Abs: 5.4 K/uL (ref 1.4–7.7)
Neutrophils Relative %: 70.7 % (ref 43.0–77.0)
Platelets: 230 K/uL (ref 150.0–400.0)
RBC: 4.28 Mil/uL (ref 4.22–5.81)
RDW: 13.1 % (ref 11.5–15.5)
WBC: 7.7 K/uL (ref 4.0–10.5)

## 2024-04-08 NOTE — Progress Notes (Signed)
 " [facility] [address] [place] [address] Dept: [phone] Dept Fax: [phone]  Office Visit  Subjective:    Patient ID: John VEAR Claudene Mickey., male    DOB: [birth year], 74 y.o..   MRN: [id number]  Chief Complaint  Patient presents with   Back Pain    C/o having RT lower back pain x 2 weeks.    Tylenol  taken   History of Present Illness:  Patient is in today for back pain. He notes this started about 2 weeks ago. The pain is on the right, towards the lower costal margin. He notes this feels similar to an issue he had in the past related to renal cysts. He had to have two procedures to drain the renal cysts at the time and then underwent having one injected with a sclerosing agent. He has not seen any blood in his urine.  Past Medical History: Patient Active Problem List   Diagnosis Date Noted   Actinic keratosis 03/13/2024   History of malignant neoplasm of skin 03/13/2024   Lateral pain of left hip 03/13/2024   Statin myopathy 03/12/2024   Onychomycosis 12/13/2023   Travel advice encounter 12/13/2023   Left Achilles tendinitis 05/08/2023   Osteoarthritis of left hip 05/08/2023   Cervical radiculopathy 12/10/2013   NAFL (nonalcoholic fatty liver) 11/22/2006   Hyperhidrosis 11/22/2006   Type 2 diabetes mellitus (HCC) 08/29/2006   Hyperlipidemia 08/29/2006   Essential hypertension 08/29/2006   Acquired cyst of kidney 08/29/2006   Past Surgical History:  Procedure Laterality Date   CHOLECYSTECTOMY N/A 09/30/2014   Procedure: LAPAROSCOPIC CHOLECYSTECTOMY WITH INTRAOPERATIVE CHOLANGIOGRAM;  Surgeon: Jina Nephew, MD;  Location: [facility] OR;  Service: General;  Laterality: N/A;   IR RADIOLOGIST EVAL & MGMT  06/04/2019   KIDNEY CYST REMOVAL  2007   [place]   Family History  Problem Relation Age of Onset   Stroke Mother    Diabetes Father    Heart disease Father    Peripheral Artery Disease Father    Early death Father     Diabetes Brother    Peripheral Artery Disease Paternal Uncle    AAA (abdominal aortic aneurysm) Maternal Grandfather    Peripheral Artery Disease Paternal Grandfather    Diabetes Other    Hyperlipidemia Other    Hypertension Other    Stroke Other    Skin cancer Other    Outpatient Medications Prior to Visit  Medication Sig Dispense Refill   amLODipine  (NORVASC ) 5 MG tablet Take 1 tablet (5 mg total) by mouth daily. 90 tablet 3   BD PEN NEEDLE NANO 2ND GEN 32G X 4 MM MISC Use with insulin injection daily 90 each 3   Continuous Glucose Sensor (FREESTYLE LIBRE 3 PLUS SENSOR) MISC Change sensor every 15 days. 6 each 3   Evolocumab  (REPATHA  SURECLICK) 140 MG/ML SOAJ Inject 140 mg into the skin every 14 (fourteen) days. (Patient not taking: Reported on 03/13/2024) 2 mL 12   ezetimibe  (ZETIA ) 10 MG tablet Take 1 tablet (10 mg total) by mouth daily. 90 tablet 3   fluorouracil (EFUDEX) 5 % cream Apply topically 2 (two) times daily.     gabapentin  (NEURONTIN ) 100 MG capsule Take 100 mg by mouth as needed (neck pressure).     insulin glargine , 1 Unit Dial, (TOUJEO  SOLOSTAR) 300 UNIT/ML Solostar Pen Inject 45 Units into the skin See admin instructions. 6 mL 11   lisinopril -hydrochlorothiazide  (ZESTORETIC ) 20-12.5 MG tablet Take 2 tablets by mouth daily.  180 tablet 3   meloxicam  (MOBIC ) 15 MG tablet Take 1 tablet (15 mg total) by mouth daily as needed for pain. 30 tablet 0   metFORMIN  (GLUCOPHAGE ) 1000 MG tablet Take 1 tablet (1,000 mg total) by mouth 2 (two) times daily with a meal. 180 tablet 1   Multiple Vitamins-Minerals (MENS MULTIVITAMIN PLUS) TABS Take 1 tablet by mouth daily at 12 noon.     nitroGLYCERIN  (NITRODUR - DOSED IN MG/24 HR) 0.4 mg/hr patch 1/4 patch Transdermal Once a day for 30 day(s)     RSV vaccine recomb adjuvanted (AREXVY ) 120 MCG/0.5ML injection Inject into the muscle. 1 each 0   terbinafine  (LAMISIL ) 250 MG tablet Take 1 tablet (250 mg total) by mouth daily. 90 tablet 0    tirzepatide  (MOUNJARO ) 2.5 MG/0.5ML Pen Inject 2.5 mg into the skin once a week. 6 mL 3   tiZANidine (ZANAFLEX) 2 MG tablet Take 2 mg by mouth every 8 (eight) hours as needed.     typhoid (VIVOTIF) DR capsule Take 1 capsule by mouth every other day. 4 capsule 0   No facility-administered medications prior to visit.   Allergies[1]   Objective:   Today's Vitals   04/08/24 1406  BP: 134/70  Pulse: 74  Temp: 98.1 F (36.7 C)  TempSrc: Temporal  SpO2: 100%  Weight: 187 lb 12.8 oz (85.2 kg)  Height: 5' 9 (1.753 m)   Body mass index is 27.73 kg/m.   General: Well developed, well nourished. No acute distress. Back: Straight. Pain on deeper palpation just below the right costal margin in the right flank. Psych: Alert and oriented. Normal mood and affect.  Health Maintenance Due  Topic Date Due   Colonoscopy  Never done   Diabetic kidney evaluation - Urine ACR  09/24/2010   Zoster Vaccines- Shingrix (2 of 2) 01/06/2020   OPHTHALMOLOGY EXAM  02/03/2021   Diabetic kidney evaluation - eGFR measurement  05/07/2024   Imaging: MRI Abdomen w wo contrast (05/30/2019) IMPRESSION: 1. Bilateral kidney cysts are again identified. The previously characterized Bosniak category 27f lesion arising from the lateral cortex is unchanged from previous exam. Adjacent mildly complicated cyst is also unchanged compatible with a Bosniak category 12f lesion. Follow-up imaging with repeat MRI in 12 months without and with contrast material recommended. 2. No new findings. 3. Aortic Atherosclerosis (ICD10-I70.0). 4. Hepatic steatosis.    Assessment & Plan:   1. Acquired cyst of kidney (Primary) 2. Right flank pain John Beasley is concerned that he may have had a recurrent issue with a renal cyst. He has a trip to [place] pending for 04/23/2024. I will check labs to assess his kidneys and for any infection. I will repeat an MR of the Abdomen to reassess the kidney cysts. He may need a follow-up with urology (Dr.  Johann). I would also consider more of an acute muscle strain. If the work-up is benign, I would have him focus on stretches, heat, and use of Tylenol  as needed.  - MR Abdomen W Wo Contrast; Future - CBC with Differential/Platelet - Urinalysis, Routine w reflex microscopic - Basic metabolic panel with GFR  Return for Follow-up as scheduled.    Garnette CHRISTELLA Simpler, MD  I,Emily Lagle,acting as a scribe for Garnette CHRISTELLA Simpler, MD.,have documented all relevant documentation on the behalf of Garnette CHRISTELLA Simpler, MD.  I, Garnette CHRISTELLA Simpler, MD, have reviewed all documentation for this visit. The documentation on 04/08/2024 for the exam, diagnosis, procedures, and orders are all accurate and complete.     [  1]  Allergies Allergen Reactions   Codeine    Pravastatin Sodium    Simvastatin     REACTION: muscle  sore   Statins Other (See Comments)    Myopathy   "

## 2024-04-09 ENCOUNTER — Ambulatory Visit
Admission: RE | Admit: 2024-04-09 | Discharge: 2024-04-09 | Disposition: A | Source: Ambulatory Visit | Attending: Family Medicine

## 2024-04-09 DIAGNOSIS — R10A1 Flank pain, right side: Secondary | ICD-10-CM

## 2024-04-09 DIAGNOSIS — N281 Cyst of kidney, acquired: Secondary | ICD-10-CM

## 2024-04-09 MED ORDER — GADOPICLENOL 0.5 MMOL/ML IV SOLN
10.0000 mL | Freq: Once | INTRAVENOUS | Status: AC | PRN
  Administered 2024-04-09: 8 mL via INTRAVENOUS

## 2024-05-21 ENCOUNTER — Ambulatory Visit: Admitting: Podiatry

## 2024-07-19 ENCOUNTER — Ambulatory Visit

## 2024-07-22 ENCOUNTER — Ambulatory Visit
